# Patient Record
Sex: Male | Born: 1950 | Race: White | Hispanic: No | Marital: Married | Smoking: Never smoker
Health system: Southern US, Community
[De-identification: ages and names within clinical notes are randomized; demographics above are authoritative.]

## PROBLEM LIST (undated history)

## (undated) DIAGNOSIS — I4891 Unspecified atrial fibrillation: Secondary | ICD-10-CM

## (undated) DIAGNOSIS — Z7901 Long term (current) use of anticoagulants: Secondary | ICD-10-CM

## (undated) DIAGNOSIS — R002 Palpitations: Secondary | ICD-10-CM

## (undated) DIAGNOSIS — E785 Hyperlipidemia, unspecified: Secondary | ICD-10-CM

## (undated) DIAGNOSIS — I4892 Unspecified atrial flutter: Secondary | ICD-10-CM

## (undated) DIAGNOSIS — I493 Ventricular premature depolarization: Secondary | ICD-10-CM

## (undated) DIAGNOSIS — I495 Sick sinus syndrome: Secondary | ICD-10-CM

## (undated) DIAGNOSIS — IMO0002 Reserved for concepts with insufficient information to code with codable children: Secondary | ICD-10-CM

## (undated) DIAGNOSIS — Z22322 Carrier or suspected carrier of Methicillin resistant Staphylococcus aureus: Secondary | ICD-10-CM

## (undated) DIAGNOSIS — C4491 Basal cell carcinoma of skin, unspecified: Secondary | ICD-10-CM

## (undated) HISTORY — PX: KNEE ARTHROSCOPY: SHX127

## (undated) HISTORY — DX: Hyperlipidemia, unspecified: E78.5

## (undated) HISTORY — DX: Carrier or suspected carrier of methicillin resistant Staphylococcus aureus: Z22.322

## (undated) HISTORY — DX: Unspecified atrial flutter: I48.92

## (undated) HISTORY — PX: TONSILECTOMY, ADENOIDECTOMY, BILATERAL MYRINGOTOMY AND TUBES: SHX2538

## (undated) HISTORY — PX: TONSILLECTOMY: SUR1361

## (undated) HISTORY — PX: INGUINAL HERNIA REPAIR: SUR1180

## (undated) HISTORY — PX: BASAL CELL CARCINOMA EXCISION: SHX1214

## (undated) HISTORY — DX: Unspecified atrial fibrillation: I48.91

## (undated) HISTORY — PX: SQUAMOUS CELL CARCINOMA EXCISION: SHX2433

## (undated) HISTORY — DX: Ventricular premature depolarization: I49.3

---
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
## 2007-08-10 ENCOUNTER — Ambulatory Visit: Payer: Self-pay | Admitting: Internal Medicine

## 2007-08-16 ENCOUNTER — Encounter: Payer: Self-pay | Admitting: Internal Medicine

## 2007-08-16 ENCOUNTER — Ambulatory Visit: Payer: Self-pay

## 2010-07-28 ENCOUNTER — Encounter: Payer: Self-pay | Admitting: Internal Medicine

## 2010-08-20 ENCOUNTER — Encounter: Payer: Self-pay | Admitting: Internal Medicine

## 2010-09-22 ENCOUNTER — Encounter: Payer: Self-pay | Admitting: Internal Medicine

## 2010-09-26 ENCOUNTER — Encounter: Payer: Self-pay | Admitting: Internal Medicine

## 2010-09-29 ENCOUNTER — Encounter: Payer: Self-pay | Admitting: Internal Medicine

## 2010-10-06 ENCOUNTER — Encounter: Payer: Self-pay | Admitting: Internal Medicine

## 2010-10-23 ENCOUNTER — Encounter: Payer: Self-pay | Admitting: Internal Medicine

## 2010-10-23 ENCOUNTER — Ambulatory Visit (INDEPENDENT_AMBULATORY_CARE_PROVIDER_SITE_OTHER): Payer: 59 | Admitting: Internal Medicine

## 2010-10-23 ENCOUNTER — Other Ambulatory Visit: Payer: Self-pay | Admitting: Internal Medicine

## 2010-10-23 DIAGNOSIS — I495 Sick sinus syndrome: Secondary | ICD-10-CM | POA: Insufficient documentation

## 2010-10-23 DIAGNOSIS — I480 Paroxysmal atrial fibrillation: Secondary | ICD-10-CM | POA: Insufficient documentation

## 2010-10-23 DIAGNOSIS — I4891 Unspecified atrial fibrillation: Secondary | ICD-10-CM | POA: Insufficient documentation

## 2010-10-23 HISTORY — DX: Sick sinus syndrome: I49.5

## 2010-10-23 LAB — BASIC METABOLIC PANEL
CO2: 25 mEq/L (ref 19–32)
Calcium: 9.2 mg/dL (ref 8.4–10.5)
Creatinine, Ser: 0.9 mg/dL (ref 0.4–1.5)
GFR: 91.99 mL/min (ref >60.00)
Sodium: 140 mEq/L (ref 135–145)

## 2010-10-27 ENCOUNTER — Ambulatory Visit (HOSPITAL_COMMUNITY)
Admission: RE | Admit: 2010-10-27 | Discharge: 2010-10-27 | Disposition: A | Discharge status: Home / Self Care | Payer: 59 | Source: Ambulatory Visit | Attending: Interventional Cardiology | Admitting: Interventional Cardiology

## 2010-10-27 DIAGNOSIS — I4891 Unspecified atrial fibrillation: Secondary | ICD-10-CM | POA: Insufficient documentation

## 2010-10-29 NOTE — Assessment & Plan Note (Signed)
Summary: per Rickey Primus card [phone] recs will be fax/ref dr Sherilyn Cooter s...   Visit Type:  Initial Consult Referring Provider:  Dr Katrinka Blazing Primary Provider:  Marlou Starks, MD [facility])   History of Present Illness: Mr Duell is a pleasant 60 yo WM with a h/o persistant atrial fibrillation who presents today for EP consultation regarding his atrial fibrillation.  He reports initially being diagnosed with atrial fibrillation in 2008.  He was evaluated for dizziness and palpitations by Dr Duaine Dredge.  EKG at that time was sinus rhythm.  He was evaluated by DR Graciela Husbands at that time and found to have PACs and PVCs.  He reports recently having further episodes of presyncope as well as palpitations.  He has been found to have atrial fibrillation with tachy/brady syndrome.  In retrospect, he feels that he has had episodes of afib for 4-5 years.  Episodes have increased in frequency and duration over the past few months.  He has been placed on metoprolol and feels that symptoms have improved.  He continues to have palpitations.  He reports frequent fatigue and feels "washed out" frequently. Over the past few months, he has had episodes of presyncope.  He describes abrupt onset of a "rush of heat" with presyncope lasting several seconds before resolving.  He feels that he might pass out but has not had syncope.  He denies chest pain, SOB, orthopnea, PND, or edema.    Current Medications (verified): 1)  Metoprolol Succinate 100 Mg Xr24h-Tab (Metoprolol Succinate) .... Take One Tablet By Mouth Daily 2)  Atorvastatin Calcium 10 Mg Tabs (Atorvastatin Calcium) .... Once Daily 3)  Aspirin Ec 325 Mg Tbec (Aspirin) .... Take One Tablet By Mouth Daily  Allergies (verified): No Known Drug Allergies  Past History:  Past Medical History: Persistent atrial fibrillaiton Tachy/brady syndrome PVCs/PACs HL Allergic rhinitis  denies h/o rheumatic fever     Past Surgical History:  1. Inguinal herniorrhaphy  bilaterally.   2. Arthroscopic knee surgery.   3. Tonsillectomy and adenoidectomy.   4. basal cell skin ca removed  Family History: Parents and sister are alive and healthy.  Denies FH of atrial fibrillation.  Denies FH of sudden death.  Social History:   He is married and lives in [place].  He has two children, no grandchildren.   He works in [employer].  He does not use cigarettes or recreational drugs.  He does use alcohol occasionally (only a couple drinks per week) and drinks a couple of caffeinated  beverages a day.      Review of Systems       All systems are reviewed and negative except as listed in the HPI.   + snores  Vital Signs:  Patient profile:   60 year old male Height:      71 inches Weight:      204 pounds BMI:     28.56 Pulse rate:   83 / minute BP sitting:   122 / 80  (left arm)  Vitals Entered By: Laurance Flatten CMA (October 23, 2010 10:39 AM)  Physical Exam  General:  Well developed, well nourished, in no acute distress. Head:  normocephalic and atraumatic Eyes:  PERRLA/EOM intact; conjunctiva and lids normal. Mouth:  Teeth, gums and palate normal. Oral mucosa normal. Neck:  supple Lungs:  Clear bilaterally to auscultation and percussion. Heart:  iRRR, no m/r/g Abdomen:  Bowel sounds positive; abdomen soft and non-tender without masses, organomegaly, or hernias noted. No hepatosplenomegaly. Msk:  Back normal, normal gait. Muscle  strength and tone normal. Extremities:  No clubbing or cyanosis. Neurologic:  Alert and oriented x 3. Skin:  Intact without lesions or rashes. Psych:  Normal affect.   Holter Monitor  Procedure date:  09/26/2010  Findings:      48 hour holter performed by Dr Katrinka Blazing  afib and atrial flutter are observed with RVR.  SInus rhythm is also observed. Heart rates up to 180 bpm observed.  Bradycardia was also observed with a post conversion pause of 1.9 seconds.  EKG  Procedure date:  10/23/2010  Findings:      afib, V rate  83 bpm, otherwise normal ekg  Echocardiogram  Procedure date:  08/20/2010  Findings:      EF 55-60% with nl LV size.  nl RV size and function. Trace MR, mild TR. LA 41mm, LVEDD 44  Impression & Recommendations:  Problem # 1:  ATRIAL FIBRILLATION (ICD-427.31) The patient has symptomatic paroxysmal atrial fibrillation and also atrial flutter.   He has not tried an antiarrhythmic medicine.  Previously documented frequent PACs suggest that this is likely triggered afib.  His LA is only mildly enlarged without evidence of significant structural heart disease. His CHADS2 score is 0.  He has had symptoms of post termination pauses with presyncope but no frank syncope.  He has also been observed to have significant elevation in heart rates, however further titration of his beta blocker has been limited by bradycardia. Therapeutic strategies for afib including medicine and ablation were discussed in detail with the patient today. Risk, benefits, and alternatives to EP study and radiofrequency ablation for afib were also discussed in detail today.  As our guidelines support afib ablation for secondary prevention in patients who have previously failed AAD, I think that the best options at this time is to initiate an AAD.  Our options would include flecainide and tikosyn.  After a long discussion, the patient prefers flecainide. We will therefore initiate pradaxa 150mg  two times a day today.  He will proceed with TEE guided cardioversion on Monday.  IF upon presentation to [facility], he can have his TEE cancelled and proceed directly to initation of flecainide.  If in afib on Monday, he should have TEE guided cardioversion with initiation of flecainide 100mg  two times a day at that time.   HE should continue metoprolol but hold it the morning of the procedure. I would like to see him back in 4 weeks.  If he is maintaining sinus rhythm, then he will need a GXT myoview at that time.  If he fails medical therpay  with flecainide, then I would have a low threshold to proceed with ablation.  We also dicsussed treatment options for his post termination pauses.  At this time, he is very clear that he wishes to avoid PPM.  We will follow his bradycardia closely.  He is instructed to contact my office if syncope occurs.  Other Orders: TLB-BMP (Basic Metabolic Panel-BMET) (80048-METABOL)  Patient Instructions: 1)  Your physician recommends that you schedule a follow-up appointment in: 4 weeks with Dr Johney Frame 2)  Your physician recommends that you return for lab work today 3)  Dr Anne Fu to do a TEE on Monday and start Flecainide 100mg  two times a day 4)  Start Pradaxa 150mg  two times a day today Prescriptions: PRADAXA 150 MG CAPS (DABIGATRAN ETEXILATE MESYLATE) one po two times a day  #60 x 3   Entered by:   Dennis Bast, RN, BSN   Authorized by:   Fayrene Fearing  Wayde Gopaul, MD   Signed by:   Dennis Bast, RN, BSN on 10/23/2010   Method used:   Electronically to        [facility]  [address] [phone]* (retail)       [address]       [place], [place]  [id number]       Ph: [phone] or [phone]       Fax: [phone]   RxID:   [id number]

## 2010-11-06 ENCOUNTER — Encounter: Payer: Self-pay | Admitting: Internal Medicine

## 2010-11-07 ENCOUNTER — Encounter: Payer: Self-pay | Admitting: Internal Medicine

## 2010-11-10 ENCOUNTER — Encounter: Payer: Self-pay | Admitting: Internal Medicine

## 2010-11-10 ENCOUNTER — Ambulatory Visit (INDEPENDENT_AMBULATORY_CARE_PROVIDER_SITE_OTHER): Payer: 59 | Admitting: Internal Medicine

## 2010-11-10 DIAGNOSIS — I4891 Unspecified atrial fibrillation: Secondary | ICD-10-CM

## 2010-11-10 DIAGNOSIS — I4892 Unspecified atrial flutter: Secondary | ICD-10-CM

## 2010-11-11 ENCOUNTER — Telehealth: Payer: Self-pay | Admitting: Internal Medicine

## 2010-11-12 ENCOUNTER — Encounter: Payer: Self-pay | Admitting: Internal Medicine

## 2010-11-14 NOTE — Op Note (Signed)
  NAMEGREGOR, DERSHEM             ACCOUNT NO.:  [id number]  MEDICAL RECORD NO.:  [id number]           PATIENT TYPE:  O  LOCATION:  [facility]                         FACILITY:  [facility]  PHYSICIAN:  Corky Crafts, MDDATE OF BIRTH:  05-05-1951  DATE OF PROCEDURE:  10/27/2010 DATE OF DISCHARGE:  10/27/2010                              OPERATIVE REPORT   PROCEDURE PERFORMED:  Transesophageal echocardiogram with DC cardioversion.  OPERATION:  Corky Crafts, MD  INDICATIONS:  Atrial fibrillation.  NARRATIVE:  TEE was performed and no left atrial appendage thrombus was noted.  Defibrillation pads were placed on the anterior chest wall and back.  Anesthesia was provided by Dr. Chaney Malling.  He gave 100 mg of propofol IV.  The patient then received one single biphasic shock at 120 joules, this restored normal sinus rhythm.  He was monitored postprocedure.  There were no apparent complications.  RECOMMENDATIONS:  The patient will start flecainide 100 mg b.i.d.  He will also continue Pradaxa for at least 30 days if not longer.  This will be discussed with Dr. Johney Frame and Dr. Katrinka Blazing.     Corky Crafts, MD     JSV/MEDQ  D:  10/27/2010  T:  10/28/2010  Job:  [id number]  cc:   Lyn Records, M.D. Hillis Range, MD  Electronically Signed by Lance Muss MD on 11/13/2010 10:29:32 AM

## 2010-11-18 NOTE — Progress Notes (Addendum)
Summary: pt calling re setting up ablation  Phone Note Call from Patient   Caller: Patient [phone] Reason for Call: Talk to Nurse Summary of Call: pt was in yesterday, said he was to be called back today re scheduling an ablation Initial call taken by: Glynda Jaeger,  November 11, 2010 4:30 PM  Follow-up for Phone Call        11/11/10--1650pm--pt calling to set up ablation--advised i would let kelly,dr Lindy Garczynski's nurse know and she would call back to set up--pt agrees Follow-up by: Ledon Snare, RN,  November 11, 2010 4:48 PM

## 2010-11-18 NOTE — Letter (Signed)
Summary: [facility]   Imported By: Marylou Mccoy 11/11/2010 16:22:25  _____________________________________________________________________  External Attachment:    Type:   Image     Comment:   External Document

## 2010-11-18 NOTE — Letter (Signed)
Summary: [facility]   Imported By: Marylou Mccoy 11/11/2010 16:20:43  _____________________________________________________________________  External Attachment:    Type:   Image     Comment:   External Document

## 2010-11-18 NOTE — Letter (Signed)
Summary: [facility]   Imported By: Marylou Mccoy 11/11/2010 16:22:40  _____________________________________________________________________  External Attachment:    Type:   Image     Comment:   External Document

## 2010-11-18 NOTE — Letter (Signed)
Summary: [facility]   Imported By: Marylou Mccoy 11/11/2010 16:15:30  _____________________________________________________________________  External Attachment:    Type:   Image     Comment:   External Document

## 2010-11-18 NOTE — Assessment & Plan Note (Signed)
Summary: 4wk f/u ok per kelly/sl   Visit Type:  Follow-up Referring Provider:  Dr Katrinka Blazing Primary Provider:  Marlou Starks, MD [facility])   History of Present Illness: Mr Bertsch is a pleasant 60 yo WM with a h/o persistant atrial fibrillation who presents today for EP follow-up regarding his atrial fibrillation.  He reports initially being diagnosed with atrial fibrillation in 2008.  He was evaluated for dizziness and palpitations by Dr Duaine Dredge.  EKG at that time was sinus rhythm.  He was evaluated by DR Graciela Husbands at that time and found to have PACs and PVCs.  He reports recently having further episodes of presyncope as well as palpitations.  He has been found to have atrial fibrillation with tachy/brady syndrome.  In retrospect, he feels that he has had episodes of afib for 4-5 years.  Episodes have increased in frequency and duration over the past few months.  He has been placed on metoprolol and feels that symptoms have improved.  He continues to have palpitations.  He reports frequent fatigue and feels "washed out" frequently. Over the past few months, he has had episodes of presyncope.  He describes abrupt onset of a "rush of heat" with presyncope lasting several seconds before resolving.  He feels that he might pass out but has not had syncope.  He denies chest pain, SOB, orthopnea, PND, or edema.    Upon last being seen by me, he was initiated on pradaxa.  He underwent cardioversion and was placed on flecainide 100mg  two times a day.  HE reports feeling "great" for 3 days before returning to afib.  Unfurtunately, he developed worsening afib thereafter.  He was placed on digoxin and metoprolol was increased to 75mg  two times a day.  He reports significant fatigue with increased metoprolol.  Current Medications (verified): 1)  Metoprolol Succinate 50 Mg Xr24h-Tab (Metoprolol Succinate) .... Take 1/2 Tabs Two Times A Day 2)  Atorvastatin Calcium 10 Mg Tabs (Atorvastatin Calcium) .... Once  Daily 3)  Pradaxa 150 Mg Caps (Dabigatran Etexilate Mesylate) .... One Po Two Times A Day 4)  Digoxin 0.25 Mg Tabs (Digoxin) .... Take One Tablet By Mouth Daily  Allergies (verified): No Known Drug Allergies  Past History:  Past Medical History: Reviewed history from 10/23/2010 and no changes required. Persistent atrial fibrillaiton Tachy/brady syndrome PVCs/PACs HL Allergic rhinitis  denies h/o rheumatic fever     Past Surgical History: Reviewed history from 10/23/2010 and no changes required.  1. Inguinal herniorrhaphy bilaterally.   2. Arthroscopic knee surgery.   3. Tonsillectomy and adenoidectomy.   4. basal cell skin ca removed  Family History: Reviewed history from 10/23/2010 and no changes required. Parents and sister are alive and healthy.  Denies FH of atrial fibrillation.  Denies FH of sudden death.  Social History: Reviewed history from 10/23/2010 and no changes required.   He is married and lives in [place].  He has two children, no grandchildren.   He works in [employer].  He does not use cigarettes or recreational drugs.  He does use alcohol occasionally (only a couple drinks per week) and drinks a couple of caffeinated  beverages a day.      Review of Systems       All systems are reviewed and negative except as listed in the HPI.   Vital Signs:  Patient profile:   60 year old male Height:      71 inches Weight:      202 pounds BMI:     28.28  Pulse rate:   91 / minute Pulse rhythm:   irregularly irregular BP sitting:   94 / 62  (left arm)  Vitals Entered By: Laurance Flatten CMA (November 10, 2010 9:51 AM)  Physical Exam  General:  Well developed, well nourished, in no acute distress. Head:  normocephalic and atraumatic Eyes:  PERRLA/EOM intact; conjunctiva and lids normal. Mouth:  Teeth, gums and palate normal. Oral mucosa normal. Neck:  supple Lungs:  Clear bilaterally to auscultation and percussion. Heart:  iRRR, no m/r/g Abdomen:  Bowel sounds  positive; abdomen soft and non-tender without masses, organomegaly, or hernias noted. No hepatosplenomegaly. Msk:  Back normal, normal gait. Muscle strength and tone normal. Extremities:  No clubbing or cyanosis. Neurologic:  Alert and oriented x 3. Skin:  Intact without lesions or rashes. Psych:  Normal affect.   EKG  Procedure date:  11/10/2010  Findings:      afib, V rate 90 bpm, nonspecific ST/T changes  Impression & Recommendations:  Problem # 1:  ATRIAL FIBRILLATION (ICD-427.31) The patient has symptomatic persistent atrial fibrillation and also typical appearing atrial flutter.  He has failed medical therapy with flecainide and is not well tolerating metoprolol for rate control.  Therapeutic strategies for afib and atrial flutter including medicine and ablation were discussed in detail with the patient today. Risk, benefits, and alternatives to EP study and radiofrequency ablation were also discussed in detail today. These risks include but are not limited to stroke, bleeding, vascular damage, tamponade, perforation, damage to the esophagus, lungs, and other structures, pulmonary vein stenosis, worsening renal function, and death. The patient understands these risk and wishes to proceed.   We will therefore schedule afib ablation at the next available time.  He will continue rate control in the interim.  He will try to reduce metoprolol to 50mg  two times a day but will return to 75mg  two times a day if heart rates are consistantly >110 bpm (per RACE II study findings).  Problem # 2:  BRADYCARDIA-TACHYCARDIA SYNDROME (ICD-427.81) stable no changes

## 2010-11-19 ENCOUNTER — Ambulatory Visit: Payer: 59 | Admitting: Internal Medicine

## 2010-11-25 ENCOUNTER — Encounter: Payer: Self-pay | Admitting: *Deleted

## 2010-11-25 NOTE — Letter (Signed)
Summary: [facility]   Imported By: Marylou Mccoy 11/19/2010 14:30:23  _____________________________________________________________________  External Attachment:    Type:   Image     Comment:   External Document

## 2010-11-25 NOTE — Letter (Signed)
Summary: Douglas Rodriguez   Imported By: Marylou Mccoy 11/19/2010 14:40:45  _____________________________________________________________________  External Attachment:    Type:   Image     Comment:   External Document

## 2010-12-04 ENCOUNTER — Other Ambulatory Visit (INDEPENDENT_AMBULATORY_CARE_PROVIDER_SITE_OTHER): Payer: 59 | Admitting: *Deleted

## 2010-12-04 DIAGNOSIS — I4891 Unspecified atrial fibrillation: Secondary | ICD-10-CM

## 2010-12-04 LAB — CBC WITH DIFFERENTIAL/PLATELET
Basophils Relative: 0.5 % (ref 0.0–3.0)
Eosinophils Relative: 5.1 % — ABNORMAL HIGH (ref 0.0–5.0)
HCT: 48.1 % (ref 39.0–52.0)
Hemoglobin: 16.7 g/dL (ref 13.0–17.0)
Lymphs Abs: 2.4 10*3/uL (ref 0.7–4.0)
Monocytes Relative: 7.5 % (ref 3.0–12.0)
Neutro Abs: 5.2 10*3/uL (ref 1.4–7.7)
Platelets: 168 10*3/uL (ref 150.0–400.0)
RBC: 5.18 Mil/uL (ref 4.22–5.81)
WBC: 8.8 10*3/uL (ref 4.5–10.5)

## 2010-12-04 LAB — BASIC METABOLIC PANEL
Chloride: 109 mEq/L (ref 96–112)
GFR: 82.37 mL/min (ref >60.00)
Potassium: 4.1 mEq/L (ref 3.5–5.1)
Sodium: 141 mEq/L (ref 135–145)

## 2010-12-10 ENCOUNTER — Ambulatory Visit (HOSPITAL_COMMUNITY)
Admission: RE | Admit: 2010-12-10 | Discharge: 2010-12-10 | Disposition: A | Discharge status: Home / Self Care | Payer: 59 | Source: Ambulatory Visit | Attending: Interventional Cardiology | Admitting: Interventional Cardiology

## 2010-12-10 DIAGNOSIS — I4891 Unspecified atrial fibrillation: Secondary | ICD-10-CM | POA: Insufficient documentation

## 2010-12-11 ENCOUNTER — Observation Stay (HOSPITAL_COMMUNITY)
Admission: RE | Admit: 2010-12-11 | Discharge: 2010-12-12 | Disposition: A | Discharge status: Home / Self Care | Payer: 59 | Source: Ambulatory Visit | Attending: Internal Medicine | Admitting: Internal Medicine

## 2010-12-11 DIAGNOSIS — E785 Hyperlipidemia, unspecified: Secondary | ICD-10-CM | POA: Insufficient documentation

## 2010-12-11 DIAGNOSIS — I4949 Other premature depolarization: Secondary | ICD-10-CM | POA: Insufficient documentation

## 2010-12-11 DIAGNOSIS — I491 Atrial premature depolarization: Secondary | ICD-10-CM | POA: Insufficient documentation

## 2010-12-11 DIAGNOSIS — I4891 Unspecified atrial fibrillation: Principal | ICD-10-CM | POA: Insufficient documentation

## 2010-12-11 DIAGNOSIS — J309 Allergic rhinitis, unspecified: Secondary | ICD-10-CM | POA: Insufficient documentation

## 2010-12-11 DIAGNOSIS — Z85828 Personal history of other malignant neoplasm of skin: Secondary | ICD-10-CM | POA: Insufficient documentation

## 2010-12-11 DIAGNOSIS — Z79899 Other long term (current) drug therapy: Secondary | ICD-10-CM | POA: Insufficient documentation

## 2010-12-11 HISTORY — PX: ATRIAL FIBRILLATION ABLATION: SHX5732

## 2010-12-11 LAB — POCT ACTIVATED CLOTTING TIME
Activated Clotting Time: 299 seconds
Activated Clotting Time: 281 seconds
Activated Clotting Time: 164 seconds

## 2010-12-13 NOTE — Discharge Summary (Addendum)
Douglas, Rodriguez             ACCOUNT NO.:  [id number]  MEDICAL RECORD NO.:  [id number]           PATIENT TYPE:  I  LOCATION:  2918                         FACILITY:  [facility]  PHYSICIAN:  Hillis Range, MD       DATE OF BIRTH:  11/30/1950  DATE OF ADMISSION:  12/11/2010 DATE OF DISCHARGE:  12/12/2010                              DISCHARGE SUMMARY   DISCHARGE DIAGNOSES: 1. Persistent atrial fibrillation.     a.     Initially diagnosed in 2008.     b.     Holter monitor in January 2012 demonstrated atrial      fibrillation and atrial flutter with rapid ventricular response.      Heart rates up to 180 with bradycardia with post-conversion pause      of 1.96 seconds at that time.     c.     Failed flecainide therapy.     d.     Status post electrophysiology study/radiofrequency ablation      of atrial fibrillation on December 11, 2010, by Dr. Johney Frame. 2. Premature ventricular contractions/premature atrial contractions.3. Hyperlipidemia. 4. Allergic rhinitis. 5. Status post inguinal hernia repair bilaterally. 6. Status post arthroscopic knee surgery. 7. Status post tonsillectomy and adenoidectomy. 8. Status post basal cell skin cancer removal.  HOSPITAL COURSE:  Douglas Rodriguez is a 60 year old gentleman with a history of atrial fibrillation dating back to 2008 as well as a history of tachybrady syndrome, PACs, PVCs, and hyperlipidemia.  He has been followed by Dr. Johney Frame for atrial fibrillation.  He underwent cardioversion this past year and was placed on flecainide, but returned to AFib and developed worsening AFib afterwards.  He was subsequently placed on digoxin and metoprolol was increased, but he reported significant fatigue with metoprolol.  Due to fact that he failed medical therapy and was persistently symptomatic, Dr. Johney Frame discussed the idea of AFib ablation with him.  He was subsequently brought into the hospital yesterday for this procedure and had successful  cardioversion to normal sinus rhythm with radiofrequency ablation of SVT.  The patient tolerated the procedure well.  He was noted to be MRSA positive for colonization on PCR nasal screening, was treated in the hospital with chlorhexidine and mupirocin and will be treated for 5 days in the outpatient.  Dr. Johney Frame has seen and examined him today and feels he is stable for discharge.  DISCHARGE LABORATORY DATA:  MRSA screen positive.  STUDIES:  EP study/coronary sinus pacing and recording, 3-D mapping of SVT with radiofrequency ablation, pulmonary vein venography, cardioversion, intracardiac echocardiography, and transseptal puncture of intact septum on December 11, 2010, by Dr. Johney Frame, please see full report for details.  DISCHARGE MEDICATIONS: 1. Chlorhexidine 1 application topically daily for 5 days. 2. Mupirocin 2% ointment 1 application nasally b.i.d. for 5 days. 3. Metoprolol titrate 50 mg decreased to 1/2 tablet b.i.d. 4. Allegra 180 mg daily. 5. Clotrimazole 1% topically daily. 6. Lipitor 10 mg daily. 7. Pradaxa 150 mg b.i.d. 8. Visine D 1 drop both eyes daily as needed. 9. Protonix 40mg  daily.  Please note the patient's digoxin was stopped this admission.  DISPOSITION:  Douglas Rodriguez will be discharged in stable condition to home.  He is not to lift anything over 5 pounds for 4 days or participate in sexual activity for 4 days.  He is not to drive for a day.  He should follow a low-sodium, heart-healthy diet and call or return if he notices any pain, swelling, bleeding, or pus at the cath site.  He will follow up with Dr. Johney Frame in approximately 3 months and our office will call him with this appointment.  He was also instructed by Dr. Johney Frame not to return to work for 1 week and was given a work note for such.  DURATION OF DISCHARGE ENCOUNTER:  Greater than 30 minutes including physician and PA time.     Ronie Spies, P.A.C.   ______________________________ Hillis Range, MD    DD/MEDQ  D:  12/12/2010  T:  12/13/2010  Job:  [id number]  cc:   Marlou Starks  Electronically Signed by Ronie Spies  on 12/13/2010 10:35:52 AM Electronically Signed by Hillis Range MD on 12/22/2010 09:20:27 AM

## 2010-12-22 NOTE — Op Note (Signed)
Douglas Rodriguez, Douglas Rodriguez             ACCOUNT NO.:  [id number]  MEDICAL RECORD NO.:  [id number]           PATIENT TYPE:  I  LOCATION:  2918                         FACILITY:  [facility]  PHYSICIAN:  Hillis Range, MD       DATE OF BIRTH:  23-Jan-1951  DATE OF PROCEDURE:  12/11/2010 DATE OF DISCHARGE:                              OPERATIVE REPORT   SURGEON:  Hillis Range, MD  PREPROCEDURE DIAGNOSIS:  Persistent atrial fibrillation.  POSTPROCEDURE DIAGNOSES:  Persistent atrial fibrillation.  PROCEDURE: 1. Comprehensive EP study. 2. Coronary sinus pacing and recording. 3. A 3-D mapping of SVT. 4. Radiofrequency ablation of SVT. 5. Arterial blood pressure monitoring. 6. Intracardiac echocardiography. 7. Transseptal puncture of an intact septum. 8. Pulmonary vein venography. 9. Cardioversion.  INTRODUCTION:  Douglas Rodriguez is a very pleasant 60 year old gentleman with symptomatic persistent atrial fibrillation.  He has failed medical therapy with flecainide, digoxin, and metoprolol.  He therefore presents today for EP study and radiofrequency ablation.  DESCRIPTION OF PROCEDURE:  Informed written consent was obtained, and the patient was brought to the electrophysiology lab in the fasting state.  He was adequately sedated with intravenous medications as outlined in the anesthesia report.  The patient's right and left groins were prepped and draped in the usual sterile fashion by the EP lab staff.  Using a percutaneous Seldinger technique, two 7-French and one 8- [place] hemostasis sheaths were placed in the right common femoral vein. A 4-French hemostasis sheath was placed in the right common femoral artery for blood pressure monitoring.  An 11-French hemostasis sheath was placed in the left common femoral vein.  A 7-French decapolar Biosense Webster coronary sinus catheter was introduced through the right common femoral vein and advanced into the coronary sinus for recording and pacing  from this location.  A 6-French quadripolar Josephson catheter was introduced through the right common femoral vein and advanced into the right ventricle for recording and pacing.  This catheter was then pulled back to the His bundle location.  The patient presented to the electrophysiology lab in atrial fibrillation.  His QRS duration measured 68 msec with a QT interval of 335 msec and an HV interval of 38 msec.  A 10-French Biosense Webster AcuNav intracardiac echocardiography catheter was introduced through the left common femoral vein and advanced into the right atrium.  Intracardiac echocardiography was performed which revealed a moderate-sized left atrium.  The patient was noted to have a large common ostium to the left superior and left inferior pulmonary veins.  This was a short common segment before each vein branched.  The right superior and right inferior pulmonary veins were noted to be moderate in size.  The middle right common femoral vein sheath was exchanged for an 8.5-French SL2 transseptal sheath and transseptal access was achieved in a standard fashion using a Brockenbrough needle under biplane fluoroscopy with intracardiac echocardiography confirmation of the transseptal puncture.  Once transseptal access had been achieved, heparin was administered intravenously and intra-arterially in order to maintain an ACT of greater than 300 seconds throughout the procedure.  A 6-French multipurpose angiographic catheter with guidewire was introduced through the transseptal  sheath and positioned over the mouth of all four pulmonary veins.  Pulmonary venograms were performed by hand injection of nonionic contrast.  This demonstrated a large common ostium which was very short to the left superior and left inferior pulmonary veins.  The left inferior and left superior pulmonary veins were noted to be large in size.  The right superior and right lower pulmonary veins were moderate in  size.  The angiographic catheter was then removed.  The His bundle catheter was removed and in its place a 3.5-mm Biosense Webster EZ STEER cervical ablation catheter was advanced into the right atrium. The transseptal sheath was pulled back into the IVC over a guidewire. The ablation catheter was advanced across the transseptal hole using the wire as a guide.  The transseptal sheath was then re-advanced over the guidewire into the left atrium.  A dual decapolar Biosense Webster circular mapping catheter was introduced through the transseptal sheath and positioned over the mouth of all four pulmonary veins.  Three- dimensional electroanatomical mapping was performed using the Carto mapping system.  The patient was found to have prodigious conduction within all four pulmonary veins at baseline.  The patient underwent successful sequential electrical isolation and anatomical encircling of the pulmonary veins using radiofrequency current with a circular mapping catheter as a guide.  The left superior and left inferior pulmonary veins were isolated together at their common segment using a WACA approach.  Complex fractionated atrial electrograms were then identified and ablated along the roof of the left atrium, the interatrial septum, the base of the left atrial appendage, along the lateral wall of the left atrium, and above the coronary sinus along the mitral valve annulus.  The patient was then successfully cardioverted to sinus rhythm with a single synchronized 360-joule biphasic shock with cardioversion electrodes in the anterior-posterior thoracic configuration.  He remained in sinus rhythm thereafter.  Pulmonary vein isolation was again confirmed in sinus rhythm with pacing to confirm entrance block.  The ablation catheter was then pulled back into the right atrium.  Series of radiofrequency applications were delivered along the usual cavotricuspid isthmus between the tricuspid valve  annulus and the inferior vena cava. During ablation, the patient transiently converted to atrial fibrillation but then spontaneously returned to sinus.  Following ablation, the AH interval measured 84 msec with an HV interval of 48 msec.  Ventricular pacing was performed which revealed midline decremental VA conduction with a VA Wenckebach cycle length of 450 msec. Rapid atrial pacing was performed which revealed no evidence of PR greater than RR.  The AV Wenckebach cycle length was 400 msec.  With atrial pacing down to a cycle length of 300 msec, no arrhythmias were observed.  The procedure was therefore considered completed.  All catheters were removed and the sheaths were aspirated and flushed. Intracardiac echocardiography revealed no pericardial effusion.  The patient was transferred to the recovery area for sheath removal per protocol.  A limited bedside transthoracic echocardiogram revealed no pericardial effusion.  There were no early apparent complications.  CONCLUSIONS: 1. Persistent atrial fibrillation upon presentation. 2. Successful electrical isolation and anatomical encircling of all     four pulmonary veins using a wide atrial circumferential ablation     approach.  The patient had a common ostium to the left-sided     pulmonary veins. 3. Complex fractionated atrial electrograms identified and ablated     along the roof of the left atrium, the interatrial septum, the base     of the  left atrial appendage, the lateral wall of the left atrium,     and above the coronary sinus. 4. Successful cardioversion to sinus rhythm. 5. Cavotricuspid isthmus ablation performed. 6. No inducible arrhythmias following ablation. 7. No early apparent complications.     Hillis Range, MD     JA/MEDQ  D:  12/11/2010  T:  12/12/2010  Job:  [id number]  cc:   Leighton Ruff, M.D.  Electronically Signed by Hillis Range MD on 12/22/2010 09:20:29 AM

## 2011-01-20 NOTE — Letter (Signed)
August 10, 2007    Mosetta Putt, M.D.  [address] [place], [place] [address]   RE:  Douglas Rodriguez, Douglas Rodriguez  MRN:  [id number]  /  DOB:  22-Sep-1950   Dear Douglas Rodriguez:   It was a pleasure to see Douglas Rodriguez today at your request because  of his abnormal electrocardiogram.   As you know, this is a 60 year old gentleman who has a longstanding  history of palpitations.  A couple of months ago, though, he had spells  where he became extremely lightheaded.  In the past, they have typically  lasted minutes and occasionally have lasted as long as hours, may have  been as frequent as once every 3-4 months and then as frequent as 2-4  times a week.  Apart from the lightheadedness when he feels his heart  rushing, he has had no significant symptoms and is not bothered by the  fact that they are ongoing.   He has no history of hypertension or diabetes and no chest discomfort.   He has no orthopnea or nocturnal dyspnea.  He has no history of syncope  or lightheadedness.   FAMILY HISTORY:  Negative.   Cardiac evaluation here to date has included the 24-hour Holter monitor,  which I will describe below, and previous treadmills done at various  times over the years.   PAST MEDICAL HISTORY:  Largely negative.   REVIEW OF SYSTEMS:  Notable across multiple organ systems only for the  fact that he wears glasses, and he has had some skin cancers removed.   PAST SURGICAL HISTORY:  Notable for:  1. Inguinal herniorrhaphy bilaterally.  2. Arthroscopic knee surgery.  3. Tonsillectomy and adenoidectomy.   SOCIAL HISTORY:  He is married.  He has two children, no grandchildren.  He works in Airline pilot.  He does not use cigarettes or recreational drugs.  He does use alcohol occasionally and drinks a couple of caffeinated  beverages a day.   MEDICATIONS:  Include the metoprolol that you put him on in the  succinate form at 50 mg a day and a Z-Pak.   ALLERGIES:  No known drug allergies.   PHYSICAL  EXAMINATION:  VITAL SIGNS:  Blood pressure 124/84, pulse 60.  Weight 201 pounds.  He is 5 feet 11 inches.  GENERAL:  Well-developed, well-nourished Caucasian male in no acute  distress.  HEENT:  No icterus or xanthomata.  NECK:  The neck veins were flat. Carotids were brisk and full  bilaterally without bruits.  BACK:  Without kyphosis or scoliosis.  LUNGS:  Clear.  CARDIAC:  Heart sounds were regular without murmurs or gallops.  ABDOMEN:  Soft with active bowel sounds, without midline pulsation or  hepatomegaly.  EXTREMITIES:  Femoral pulses were 2+.  Distal pulses were intact.  No  clubbing, cyanosis, or edema.  NEUROLOGIC:  Exam was grossly normal.  SKIN:  Warm and dry.   Electrocardiogram from our office today demonstrated sinus rhythm at 51  with intervals of 0.15/0.09/041.  The axis was 35 degrees.  Electrocardiogram was entirely normal.   Electrocardiogram from your office on October 23 demonstrated sinus  rhythm with interpolated left bundle inferior axis morphology PVCs with  a transition in lead V4 and an occasional PAC.   Holter monitor demonstrated rare couplets in a uniform morphology  comprising a total of 6.2% of his beats.  He also had infrequent PACs  comprising about 0.8% of his beats.   IMPRESSION:  1. 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  2. Premature atrial contractions (PACs).  3. Marked improvement since the initiation of metoprolol.   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  It has been longstanding but somewhat worse a couple of  months ago and now has gone back to his baseline relative quiescence.  This may be related to his beta blocker or not; it is hard to known.   I think, apart from his symptoms which are currently not an issue, the  other question is, does this reflect an underlying myocardial process  that is pathological.  To this  end, I thought a 2-D echocardiogram  should suffice in helping [place] understand his cardiac function, and if  this is normal, I think treating him symptomatically as you have done  would be the limit of what I would recommend currently.   Thanks very much for asking [place] to see him.    Sincerely,      Duke Salvia, MD, [facility]  Electronically Signed    SCK/MedQ  DD: 08/10/2007  DT: 08/10/2007  Job #: [id number]

## 2011-03-12 ENCOUNTER — Ambulatory Visit (INDEPENDENT_AMBULATORY_CARE_PROVIDER_SITE_OTHER): Payer: 59 | Admitting: Internal Medicine

## 2011-03-12 ENCOUNTER — Encounter: Payer: Self-pay | Admitting: Internal Medicine

## 2011-03-12 VITALS — BP 118/80 | HR 62 | Ht 71.0 in | Wt 201.0 lb

## 2011-03-12 DIAGNOSIS — I4891 Unspecified atrial fibrillation: Secondary | ICD-10-CM

## 2011-03-12 NOTE — Assessment & Plan Note (Signed)
Maintaining sinus rhythm post ablation.  His CHADSVASC score is 0.  We will therefore stop pradaxa and return to ASA 81mg  daily at this time. He will decrease toprol XL to 25mg  daily.  Return in 3 months

## 2011-03-12 NOTE — Progress Notes (Signed)
Addended by: Sherri Rad C on: 03/12/2011 03:46 PM   Modules accepted: Orders

## 2011-03-12 NOTE — Progress Notes (Signed)
The patient presents today for routine electrophysiology followup.  Since having his afib ablation, the patient reports doing very well.  He has had no further afib.  He denies procedure related complications.  He has rare PVCs.  Today, he denies symptoms of chest pain, shortness of breath, orthopnea, PND, lower extremity edema, dizziness, presyncope, syncope, or neurologic sequela.  The patient feels that he is tolerating medications without difficulties and is otherwise without complaint today.   Past Medical History  Diagnosis Date  . Atrial fibrillation     s/p PVI by JA 4/12  . PVC's (premature ventricular contractions)   . Hyperlipidemia    Past Surgical History  Procedure Date  . Laparoscopic inguinal hernia repair   . Arthroscopic knee surgury   . Tonsilectomy, adenoidectomy, bilateral myringotomy and tubes   . Basal cell skin cancer removed   . Afib ablation 12/11/10    PVI and CTI ablation by [facility]    Current Outpatient Prescriptions  Medication Sig Dispense Refill  . dabigatran (PRADAXA) 150 MG CAPS Take 150 mg by mouth every 12 (twelve) hours.        . digoxin (LANOXIN) 0.25 MG tablet Take 250 mcg by mouth daily.        . metoprolol (TOPROL-XL) 50 MG 24 hr tablet Take 75 mg by mouth 2 (two) times daily. Pt is taking 1 1/2 tablets of 50 mg 2 times a day.      Marland Kitchen atorvastatin (LIPITOR) 10 MG tablet Take 10 mg by mouth daily.        Marland Kitchen DISCONTD: aspirin 325 MG tablet Take 325 mg by mouth daily.        Marland Kitchen DISCONTD: pantoprazole (PROTONIX) 40 MG tablet         No Known Allergies  History   Social History  . Marital Status: Married    Spouse Name: N/A    Number of Children: N/A  . Years of Education: N/A   Occupational History  . Not on file.   Social History Main Topics  . Smoking status: Never Smoker   . Smokeless tobacco: Not on file  . Alcohol Use: No  . Drug Use: No  . Sexually Active: Not on file   Other Topics Concern  . Not on file   Social History Narrative     He is married and lives in [place].  He has two children, no grandchildren.  He works in Airline pilot.    Family History  Problem Relation Age of Onset  . Hypertension Neg Hx   . Hyperlipidemia Neg Hx   . Heart failure Neg Hx   . Heart disease Neg Hx   . Heart attack Neg Hx     ROS-  All systems are reviewed and are negative except as outlined in the HPI above  Physical Exam: Filed Vitals:   03/12/11 1502  BP: 118/80  Pulse: 62  Height: 5\' 11"  (1.803 m)  Weight: 201 lb (91.173 kg)    GEN- The patient is well appearing, alert and oriented x 3 today.   Head- normocephalic, atraumatic Eyes-  Sclera clear, conjunctiva pink Ears- hearing intact Oropharynx- clear Neck- supple, no JVP Lymph- no cervical lymphadenopathy Lungs- Clear to ausculation bilaterally, normal work of breathing Heart- Regular rate and rhythm, no murmurs, rubs or gallops, PMI not laterally displaced GI- soft, NT, ND, + BS Extremities- no clubbing, cyanosis, or edema MS- no significant deformity or atrophy Skin- no rash or lesion Psych- euthymic mood, full affect Neuro-  strength and sensation are intact  ekg today reveals sinus rhythm 62 bpm, otherwise normal ekg  Assessment and Plan:

## 2011-03-12 NOTE — Patient Instructions (Signed)
Your physician has recommended you make the following change in your medication:  1) Stop Pradaxa. 2) Start Aspirin 81mg  once daily. 3) Decrease Metoprolol succ to 50mg  1/2 tablet once daily.  Your physician wants you to follow-up in: 3 months. You will receive a reminder letter in the mail two months in advance. If you don't receive a letter, please call our office to schedule the follow-up appointment.

## 2011-03-18 ENCOUNTER — Encounter: Payer: 59 | Admitting: Internal Medicine

## 2011-04-20 ENCOUNTER — Encounter: Payer: Self-pay | Admitting: Internal Medicine

## 2011-06-11 ENCOUNTER — Ambulatory Visit (INDEPENDENT_AMBULATORY_CARE_PROVIDER_SITE_OTHER): Payer: 59 | Admitting: Internal Medicine

## 2011-06-11 ENCOUNTER — Encounter: Payer: Self-pay | Admitting: Internal Medicine

## 2011-06-11 VITALS — BP 138/92 | HR 54 | Resp 18 | Ht 71.0 in | Wt 202.8 lb

## 2011-06-11 DIAGNOSIS — I4891 Unspecified atrial fibrillation: Secondary | ICD-10-CM

## 2011-06-11 NOTE — Progress Notes (Signed)
The patient presents today for routine electrophysiology followup.  Since having his afib ablation, the patient reports doing very well.  He has had no further afib.  He is pleased with the results of his procedure.  Today, he denies symptoms of palpitations, chest pain, shortness of breath, orthopnea, PND, lower extremity edema, dizziness, presyncope, syncope, or neurologic sequela.  The patient feels that he is tolerating medications without difficulties and is otherwise without complaint today.   Past Medical History  Diagnosis Date  . Atrial fibrillation     s/p PVI by JA 4/12  . PVC's (premature ventricular contractions)   . Hyperlipidemia    Past Surgical History  Procedure Date  . Laparoscopic inguinal hernia repair   . Arthroscopic knee surgury   . Tonsilectomy, adenoidectomy, bilateral myringotomy and tubes   . Basal cell skin cancer removed   . Afib ablation 12/11/10    PVI and CTI ablation by [facility]    Current Outpatient Prescriptions  Medication Sig Dispense Refill  . aspirin EC 81 MG tablet Take 1 tablet (81 mg total) by mouth daily.      Marland Kitchen atorvastatin (LIPITOR) 10 MG tablet Take 10 mg by mouth daily.        . metoprolol (TOPROL-XL) 50 MG 24 hr tablet Take 1/2 tablet by mouth once daily.        No Known Allergies  History   Social History  . Marital Status: Married    Spouse Name: N/A    Number of Children: N/A  . Years of Education: N/A   Occupational History  . Not on file.   Social History Main Topics  . Smoking status: Never Smoker   . Smokeless tobacco: Not on file  . Alcohol Use: No  . Drug Use: No  . Sexually Active: Not on file   Other Topics Concern  . Not on file   Social History Narrative   He is married and lives in [place].  He has two children, no grandchildren.  He works in Airline pilot.    Family History  Problem Relation Age of Onset  . Hypertension Neg Hx   . Hyperlipidemia Neg Hx   . Heart failure Neg Hx   . Heart disease Neg Hx   .  Heart attack Neg Hx    Physical Exam: Filed Vitals:   06/11/11 1413  BP: 138/92  Pulse: 54  Resp: 18  Height: 5\' 11"  (1.803 m)  Weight: 202 lb 12.8 oz (91.989 kg)    GEN- The patient is well appearing, alert and oriented x 3 today.   Head- normocephalic, atraumatic Eyes-  Sclera clear, conjunctiva pink Ears- hearing intact Oropharynx- clear Neck- supple, no JVP Lymph- no cervical lymphadenopathy Lungs- Clear to ausculation bilaterally, normal work of breathing Heart- Regular rate and rhythm, no murmurs, rubs or gallops, PMI not laterally displaced GI- soft, NT, ND, + BS Extremities- no clubbing, cyanosis, or edema MS- no significant deformity or atrophy Skin- no rash or lesion Psych- euthymic mood, full affect Neuro- strength and sensation are intact  ekg today reveals sinus rhythm 57 bpm, otherwise normal ekg  Assessment and Plan:

## 2011-06-11 NOTE — Assessment & Plan Note (Signed)
Maintaining sinus rhythm post ablation.  His CHADSVASC score is 0.  He is on ASA 81mg  daily at this time. He will stop toprol XL today  Return in 12 months

## 2013-06-07 ENCOUNTER — Ambulatory Visit (INDEPENDENT_AMBULATORY_CARE_PROVIDER_SITE_OTHER): Payer: 59 | Admitting: Interventional Cardiology

## 2013-06-07 ENCOUNTER — Telehealth: Payer: Self-pay | Admitting: Interventional Cardiology

## 2013-06-07 ENCOUNTER — Encounter: Payer: Self-pay | Admitting: Interventional Cardiology

## 2013-06-07 VITALS — BP 110/68 | HR 136 | Ht 71.0 in | Wt 197.0 lb

## 2013-06-07 DIAGNOSIS — I4891 Unspecified atrial fibrillation: Secondary | ICD-10-CM

## 2013-06-07 DIAGNOSIS — I495 Sick sinus syndrome: Secondary | ICD-10-CM

## 2013-06-07 DIAGNOSIS — I4892 Unspecified atrial flutter: Secondary | ICD-10-CM

## 2013-06-07 DIAGNOSIS — Z7901 Long term (current) use of anticoagulants: Secondary | ICD-10-CM

## 2013-06-07 HISTORY — DX: Long term (current) use of anticoagulants: Z79.01

## 2013-06-07 MED ORDER — APIXABAN 5 MG PO TABS: 5.0000 mg | ORAL_TABLET | Freq: Two times a day (BID) | ORAL | Status: DC

## 2013-06-07 MED ORDER — METOPROLOL SUCCINATE ER 25 MG PO TB24: 25.0000 mg | ORAL_TABLET | Freq: Two times a day (BID) | ORAL | Status: DC

## 2013-06-07 NOTE — Patient Instructions (Addendum)
Stop Aspirin  Start Elquis 5mg  twice daily  Start Metoprolol 25mg  twice daily  Call if you begin to feel worse. Call the office on Friday for progress report. [phone]  Your physician recommends that you schedule a follow-up appointment in: 2 weeks with Dr.Allred

## 2013-06-07 NOTE — Progress Notes (Signed)
Patient ID: Douglas Rodriguez, male   DOB: 15-Apr-1951, 62 y.o.   MRN: [id number]     History of Present Illness: The patient had Atrial fibrillation ablation in 2012 by Dr. Johney Frame. He did great for the first year. Since then. He has been having episodes of tachycardia that can last several hours. Over the past 2 weeks. He has noted episodes last up to 24 hours. He mentioned a soma last office visit, but refused to wear a monitor. I was concerned. He was having recurrence of atrial fibrillation. He called today because he developed tachycardia last p.m. around 11:00. His been ongoing today. He states it causes him to feel somewhat sluggish and weak. No dyspnea, or chest pain. It feels very similar to what he had prior to his last experiences with atrial fib. He is not had any neurological complaints. He is taking one aspirin per day. He is taking 25 mg of metoprolol succinate each time the arrhythmia has occurred over the past month.      Past Medical History  Diagnosis Date  . Atrial fibrillation     s/p PVI by JA 4/12  . PVC's (premature ventricular contractions)   . Hyperlipidemia     Past Surgical History  Procedure Laterality Date  . Laparoscopic inguinal hernia repair    . Arthroscopic knee surgury    . Tonsilectomy, adenoidectomy, bilateral myringotomy and tubes    . Basal cell skin cancer removed    . Afib ablation  12/11/10    PVI and CTI ablation by [facility]    Current Outpatient Prescriptions  Medication Sig Dispense Refill  . aspirin EC 81 MG tablet Take 1 tablet (81 mg total) by mouth daily.      Marland Kitchen atorvastatin (LIPITOR) 10 MG tablet Take 10 mg by mouth daily.        Marland Kitchen apixaban (ELIQUIS) 5 MG TABS tablet Take 1 tablet (5 mg total) by mouth 2 (two) times daily.  60 tablet  0  . metoprolol succinate (TOPROL-XL) 25 MG 24 hr tablet Take 1 tablet (25 mg total) by mouth 2 (two) times daily.  30 tablet  5   No current facility-administered medications for this visit.    No Known  Allergies  History   Social History  . Marital Status: Married    Spouse Name: N/A    Number of Children: N/A  . Years of Education: N/A   Occupational History  . Not on file.   Social History Main Topics  . Smoking status: Never Smoker   . Smokeless tobacco: Not on file  . Alcohol Use: No  . Drug Use: No  . Sexual Activity: Not on file   Other Topics Concern  . Not on file   Social History Narrative   He is married and lives in [place].  He has two children, no grandchildren.     He works in Airline pilot.    Family History  Problem Relation Age of Onset  . Hypertension Neg Hx   . Hyperlipidemia Neg Hx   . Heart failure Neg Hx   . Heart disease Neg Hx   . Heart attack Neg Hx     Review of Systems:  As stated in the HPI and otherwise negative.   BP 110/68  Pulse 136  Ht 5\' 11"  (1.803 m)  Wt 197 lb (89.359 kg)  BMI 27.49 kg/m2  Physical Examination: General: Well developed, well nourished, NAD HEENT: OP clear, mucus membranes moist SKIN: warm, dry. No  rashes. Neuro: No focal deficits Musculoskeletal: Muscle strength 5/5 all ext Psychiatric: Mood and affect normal Neck: No JVD, no carotid bruits, no thyromegaly, no lymphadenopathy. Lungs:Clear bilaterally, no wheezes, rhonci, crackles Cardiovascular: Rapid irregular rhythm. No murmur or rub.  Abdomen:Soft. Bowel sounds present. Non-tender.  Extremities: No lower extremity edema. Pulses are 2 + in the bilateral DP/PT.  EKG: atrial fibrillation (coarse) versus atrial flutter with variable response.   Assessment and Plan:   1. Recurrent atrial fibrillation/flutter post ablation. There is no evidence of heart failure, angina. Patient is ambulatory. Our goal today will be to initiate anticoagulation therapy with Eliquis 5 mg twice a day. I will resume metoprolol succinate at 50 mg per day and consider increasing the dose as needed for rate control. We spoke with Dr. Johney Frame, today, who feels that the patient may  require a repeat ablation. The patient is to call with an update on Friday morning. If he is still in the arrhythmia. We will further uptitrate beta blocker therapy. We have sent an appointment for him to see Dr. Johney Frame in 2 weeks.  2. Anticoagulation therapy with Eliquis

## 2013-06-07 NOTE — Telephone Encounter (Signed)
Spoke with patient who states he has history of atrial fib but has not had any problems since ablation in April 2012.  Patient states he thought he felt like he was out of rhythm this weekend and he listened with a stethoscope to confirm.  Patient states he went back into normal rhythm on his own and remained that way until last night when he felt the atrial fib return.  Patient states heart rate is 140-150 and he feels shaky and sweaty.  Patient states he took Toprol XL 25 mg last night and this morning.  I advised patient that I will talk to Dr. Katrinka Blazing who is in the office today and will call him back with Dr. Michaelle Copas advice.  Patient verbalized agreement and understanding.

## 2013-06-07 NOTE — Telephone Encounter (Signed)
New Problem:  PT states his is having another episode of atrial fib. Pt would like to know if he can come in to have an EKG done. Please advise

## 2013-06-07 NOTE — Telephone Encounter (Signed)
Spoke with Dr. Katrinka Blazing who advised he can see patient today in the office.  Patient states he will be here in 30-45 minutes.  I advised patient of location and scheduled appointment in [facility].

## 2013-06-09 MED ORDER — METOPROLOL SUCCINATE ER 25 MG PO TB24: 50.0000 mg | ORAL_TABLET | Freq: Two times a day (BID) | ORAL | Status: DC

## 2013-06-09 NOTE — Telephone Encounter (Signed)
per Dr.Smith pt advised to increase metoprolol to 50mg  bid. pt is ok to travel this weekent.pt advised to not over exert himself and f/u with a facility if symptoms worsen. pt will call back on Monady with an update of HR and how he is feeling. pt verbalized understanding.

## 2013-06-09 NOTE — Telephone Encounter (Signed)
Pt called today, because his heart rate is still high Afib, 140 to 150 beats per minute as when he was seen in the office 06/07/13. Pt took the Metoprolol 25 mg this AM. Pt would like to know if he can increased  The Metoprolol dose.

## 2013-06-09 NOTE — Addendum Note (Signed)
Addended by: Jarvis Newcomer on: 06/09/2013 10:31 AM   Modules accepted: Orders

## 2013-06-09 NOTE — Telephone Encounter (Signed)
Follow Up  Pt has been in Afib// Was advised to send an update// heart is still beating at 140 beats a min// request a call back to discuss.

## 2013-06-12 ENCOUNTER — Telehealth: Payer: Self-pay | Admitting: Interventional Cardiology

## 2013-06-12 NOTE — Telephone Encounter (Signed)
New Problem//UPDATE  Pt states he has been in Afib for 6 days// was advised to call and give an update// Heart is not beating as fast 85 beats a minute sporadically.  Fast to slow paces. Please call

## 2013-06-12 NOTE — Telephone Encounter (Signed)
pt adv to stay on current dosage of metoprolol 50mg  bid.pt adv to keep upcoming appt with Dr.Allred 06/28/13.

## 2013-06-28 ENCOUNTER — Ambulatory Visit (INDEPENDENT_AMBULATORY_CARE_PROVIDER_SITE_OTHER): Payer: 59 | Admitting: Internal Medicine

## 2013-06-28 ENCOUNTER — Encounter: Payer: Self-pay | Admitting: Internal Medicine

## 2013-06-28 VITALS — BP 126/78 | HR 133 | Ht 71.0 in | Wt 199.1 lb

## 2013-06-28 DIAGNOSIS — I4891 Unspecified atrial fibrillation: Secondary | ICD-10-CM

## 2013-06-28 DIAGNOSIS — I4892 Unspecified atrial flutter: Secondary | ICD-10-CM

## 2013-06-28 DIAGNOSIS — Z7901 Long term (current) use of anticoagulants: Secondary | ICD-10-CM

## 2013-06-28 MED ORDER — FLECAINIDE ACETATE 100 MG PO TABS: 100.0000 mg | ORAL_TABLET | Freq: Two times a day (BID) | ORAL | Status: DC

## 2013-06-28 NOTE — Progress Notes (Signed)
Primary Cardiolgist:  Dr Katrinka Blazing  The patient presents today for electrophysiology followup.  I have not seen him since 2012.  He has done very well s/p ablation and has maintained sinus rhythm until about a month ago when he presented to Dr Katrinka Blazing with atrial flutter with RVR.  He reports fatigue.  This is worse with increased metoprolol.  Today, he denies symptoms of palpitations, chest pain, shortness of breath, orthopnea, PND, lower extremity edema, dizziness, presyncope, syncope, or neurologic sequela.  The patient feels that he is tolerating medications without difficulties and is otherwise without complaint today.   Past Medical History  Diagnosis Date  . Atrial fibrillation     s/p PVI by [facility] 4/12  . PVC's (premature ventricular contractions)   . Hyperlipidemia   . 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 Laparoscopic inguinal hernia repair    . Arthroscopic knee surgury    . Tonsilectomy, adenoidectomy, bilateral myringotomy and tubes    . Basal cell skin cancer removed    . Afib ablation  12/11/10    PVI and [facility]I ablation by [facility]    Current Outpatient Prescriptions  Medication Sig Dispense Refill  . apixaban (ELIQUIS) 5 MG TABS tablet Take 1 tablet (5 mg total) by mouth 2 (two) times daily.  60 tablet  0  . atorvastatin (LIPITOR) 10 MG tablet Take 10 mg by mouth daily.        . metoprolol succinate (TOPROL-XL) 25 MG 24 hr tablet Taking 25 mg in the morning and 50 mg at night      . flecainide (TAMBOCOR) 100 MG tablet Take 1 tablet (100 mg total) by mouth 2 (two) times daily.  180 tablet  3   No current facility-administered medications for this visit.    No Known Allergies  History   Social History  . Marital Status: Married    Spouse Name: N/A    Number of Children: N/A  . Years of Education: N/A   Occupational History  . Not on file.   Social History Main  Topics  . Smoking status: Never Smoker   . Smokeless tobacco: Not on file  . Alcohol Use: No  . Drug Use: No  . Sexual Activity: Not on file   Other Topics Concern  . Not on file   Social History Narrative   He is married and lives in [place].  He has two children, no grandchildren.     He works in Air[employer].    Family History  Problem Relation Age of Onset  . Hypertension Neg Hx   . Hyperlipidemia Neg Hx   . Heart failure Neg Hx   . Heart disease Neg Hx   . Heart attack Neg Hx     ROS-  All systems are reviewed and are negative except as outlined in the HPI above  Physical Exam: Filed Vitals:   06/28/13 1500  BP: 126/78  Pulse: 133  Height: 5\' 11"  (1.803 m)  Weight: 199 lb 1.9 oz (90.32 kg)    GEN- The patient is well appearing, alert and oriented x 3 today.   Head- normocephalic, atraumatic Eyes-  Sclera clear, conjunctiva pink Ears- hearing intact Oropharynx- clear Neck- supple, no JVP Lymph- no cervical lymphadenopathy Lungs- Clear to ausculation bilaterally, normal work of breathing Heart- tachy irregular  rhythm, no murmurs,  GI- soft, NT, ND, + BS Extremities- no clubbing,  cyanosis, or edema MS- no significant deformity or atrophy Skin- no rash or lesion Psych- euthymic mood, full affect Neuro- strength and sensation are intact  ekg today reveals atrial flutter with variable V rate 133 bpm, nonspecific ST/T changes  Assessment and Plan:  1. Atrial flutter The patient presents with atrial flutter x 1 month.  He is appropriate anticoagulated with eliquis. Therapeutic strategies for atrial flutter including medicine, cardioversion, and ablation were discussed in detail with the patient today. Risk, benefits, and alternatives to each strategy including cardioversion, medicine, and  EP study and radiofrequency ablation were also discussed in detail today. These risks include but are not limited to stroke, bleeding, vascular damage, tamponade, perforation,  damage to the heart and other structures, AV block requiring pacemaker, worsening renal function, and death. The patient understands these risk and wishes to try flecainide at this time.  He will therefore start flecainide 100mg  BID  Today.  Continue anticoagulation and rate control Return for ekg in 1 week.  If he remains in afib then we have to consider cardioversion vs ablation at that point.  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  He feels that some how he was given MRSA at [facility] and is reluctant to have further procedures performed there.  I have tried to clarify that he was colonized upon admit and did not receive this colonization as part of his hospitalization.  I dont think he believes me.  2. afib No further afib post ablation  Return to see me in 4 weeks unless he decides to have ablation

## 2013-06-28 NOTE — Patient Instructions (Signed)
Your physician recommends that you schedule a follow-up appointment in: 1 week for an EKG and 1 month with Dr Johney Frame  Your physician has recommended you make the following change in your medication:  1) Start Flecainide 100mg  twice daily

## 2013-06-29 ENCOUNTER — Telehealth: Payer: Self-pay | Admitting: Internal Medicine

## 2013-06-29 ENCOUNTER — Other Ambulatory Visit: Payer: Self-pay | Admitting: *Deleted

## 2013-06-29 DIAGNOSIS — I4891 Unspecified atrial fibrillation: Secondary | ICD-10-CM

## 2013-06-29 MED ORDER — FLECAINIDE ACETATE 100 MG PO TABS: 100.0000 mg | ORAL_TABLET | Freq: Two times a day (BID) | ORAL | Status: DC

## 2013-06-29 NOTE — Telephone Encounter (Signed)
New message    Presc for flecainide cost [facility].00 [facility] to [facility]. Wife want to know if there is something cheaper. However, she is going [facility] and make sure they [facility] the ins correctly.  Want to talk to Kelly--pls call fri when you return.

## 2013-06-30 NOTE — Telephone Encounter (Signed)
Spoke with patient's wife.  For some reason his Rx was sent to [facility] and therefore [facility] will not fill as they state it has already been run through his insurance with them.  He had an old RX that he is taking and will start the new as soon as they come in and may need to push the EKG out to later next week

## 2013-07-02 ENCOUNTER — Emergency Department (HOSPITAL_COMMUNITY): Payer: 59

## 2013-07-02 ENCOUNTER — Encounter (HOSPITAL_COMMUNITY): Payer: Self-pay | Admitting: Emergency Medicine

## 2013-07-02 ENCOUNTER — Other Ambulatory Visit (HOSPITAL_COMMUNITY): Payer: 59

## 2013-07-02 ENCOUNTER — Emergency Department (HOSPITAL_COMMUNITY)
Admission: EM | Admit: 2013-07-02 | Discharge: 2013-07-02 | Disposition: A | Discharge status: Home / Self Care | Payer: 59 | Attending: Emergency Medicine | Admitting: Emergency Medicine

## 2013-07-02 DIAGNOSIS — I4892 Unspecified atrial flutter: Secondary | ICD-10-CM | POA: Insufficient documentation

## 2013-07-02 DIAGNOSIS — R61 Generalized hyperhidrosis: Secondary | ICD-10-CM | POA: Insufficient documentation

## 2013-07-02 DIAGNOSIS — Z79899 Other long term (current) drug therapy: Secondary | ICD-10-CM | POA: Insufficient documentation

## 2013-07-02 DIAGNOSIS — Z9889 Other specified postprocedural states: Secondary | ICD-10-CM | POA: Insufficient documentation

## 2013-07-02 DIAGNOSIS — E785 Hyperlipidemia, unspecified: Secondary | ICD-10-CM | POA: Insufficient documentation

## 2013-07-02 DIAGNOSIS — K5792 Diverticulitis of intestine, part unspecified, without perforation or abscess without bleeding: Secondary | ICD-10-CM

## 2013-07-02 DIAGNOSIS — Z8614 Personal history of Methicillin resistant Staphylococcus aureus infection: Secondary | ICD-10-CM | POA: Insufficient documentation

## 2013-07-02 DIAGNOSIS — K5732 Diverticulitis of large intestine without perforation or abscess without bleeding: Secondary | ICD-10-CM | POA: Insufficient documentation

## 2013-07-02 LAB — POCT I-STAT TROPONIN I
Troponin i, poc: 0.01 ng/mL (ref 0.00–0.08)
Troponin i, poc: 0 ng/mL (ref 0.00–0.08)

## 2013-07-02 LAB — CBC WITH DIFFERENTIAL/PLATELET
Basophils Absolute: 0 10*3/uL (ref 0.0–0.1)
HCT: 45.9 % (ref 39.0–52.0)
Lymphocytes Relative: 15 % (ref 12–46)
Lymphs Abs: 2.3 10*3/uL (ref 0.7–4.0)
MCV: 90.4 fL (ref 78.0–100.0)
Monocytes Absolute: 2 10*3/uL — ABNORMAL HIGH (ref 0.1–1.0)
Neutro Abs: 11 10*3/uL — ABNORMAL HIGH (ref 1.7–7.7)
Platelets: 152 10*3/uL (ref 150–400)
RBC: 5.08 MIL/uL (ref 4.22–5.81)
RDW: 12.8 % (ref 11.5–15.5)
WBC: 15.6 10*3/uL — ABNORMAL HIGH (ref 4.0–10.5)

## 2013-07-02 LAB — CG4 I-STAT (LACTIC ACID): Lactic Acid, Venous: 0.59 mmol/L (ref 0.5–2.2)

## 2013-07-02 LAB — COMPREHENSIVE METABOLIC PANEL
ALT: 69 U/L — ABNORMAL HIGH (ref 0–53)
AST: 33 U/L (ref 0–37)
CO2: 24 mEq/L (ref 19–32)
Chloride: 105 mEq/L (ref 96–112)
GFR calc Af Amer: 87 mL/min — ABNORMAL LOW (ref >90)
GFR calc non Af Amer: 75 mL/min — ABNORMAL LOW (ref >90)
Glucose, Bld: 112 mg/dL — ABNORMAL HIGH (ref 70–99)
Sodium: 138 mEq/L (ref 135–145)
Total Bilirubin: 1.9 mg/dL — ABNORMAL HIGH (ref 0.3–1.2)

## 2013-07-02 LAB — URINALYSIS, ROUTINE W REFLEX MICROSCOPIC
Bilirubin Urine: NEGATIVE
Hgb urine dipstick: NEGATIVE
Ketones, ur: NEGATIVE mg/dL
Protein, ur: NEGATIVE mg/dL
Urobilinogen, UA: 0.2 mg/dL (ref 0.0–1.0)

## 2013-07-02 MED ORDER — MORPHINE SULFATE 4 MG/ML IJ SOLN
4.0000 mg | Freq: Once | INTRAMUSCULAR | Status: AC
  Administered 2013-07-02: 4 mg via INTRAVENOUS
  Filled 2013-07-02: qty 1

## 2013-07-02 MED ORDER — IOHEXOL 300 MG/ML  SOLN
25.0000 mL | INTRAMUSCULAR | Status: AC | PRN
  Administered 2013-07-02 (×2): 25 mL via ORAL

## 2013-07-02 MED ORDER — CIPROFLOXACIN IN D5W 400 MG/200ML IV SOLN
400.0000 mg | Freq: Once | INTRAVENOUS | Status: AC
  Administered 2013-07-02: 400 mg via INTRAVENOUS
  Filled 2013-07-02: qty 200

## 2013-07-02 MED ORDER — METRONIDAZOLE IN NACL 5-0.79 MG/ML-% IV SOLN
500.0000 mg | Freq: Once | INTRAVENOUS | Status: AC
  Administered 2013-07-02: 500 mg via INTRAVENOUS
  Filled 2013-07-02: qty 100

## 2013-07-02 MED ORDER — SODIUM CHLORIDE 0.9 % IV BOLUS (SEPSIS)
1000.0000 mL | Freq: Once | INTRAVENOUS | Status: AC
  Administered 2013-07-02: 1000 mL via INTRAVENOUS

## 2013-07-02 MED ORDER — IOHEXOL 300 MG/ML  SOLN
100.0000 mL | Freq: Once | INTRAMUSCULAR | Status: AC | PRN
  Administered 2013-07-02: 100 mL via INTRAVENOUS

## 2013-07-02 MED ORDER — CIPROFLOXACIN HCL 500 MG PO TABS: 500.0000 mg | ORAL_TABLET | Freq: Two times a day (BID) | ORAL | Status: DC

## 2013-07-02 MED ORDER — METRONIDAZOLE 500 MG PO TABS: 500.0000 mg | ORAL_TABLET | Freq: Three times a day (TID) | ORAL | Status: DC

## 2013-07-02 MED ORDER — METOPROLOL SUCCINATE ER 25 MG PO TB24
25.0000 mg | ORAL_TABLET | Freq: Once | ORAL | Status: AC
  Administered 2013-07-02: 25 mg via ORAL
  Filled 2013-07-02: qty 1

## 2013-07-02 MED ORDER — ATORVASTATIN CALCIUM 10 MG PO TABS
10.0000 mg | ORAL_TABLET | Freq: Every day | ORAL | Status: DC
  Administered 2013-07-02: 10 mg via ORAL
  Filled 2013-07-02: qty 1

## 2013-07-02 MED ORDER — ONDANSETRON HCL 4 MG/2ML IJ SOLN
4.0000 mg | Freq: Once | INTRAMUSCULAR | Status: AC
  Administered 2013-07-02: 4 mg via INTRAVENOUS
  Filled 2013-07-02: qty 2

## 2013-07-02 MED ORDER — HYDROMORPHONE HCL PF 1 MG/ML IJ SOLN: 1.0000 mg | Freq: Once | INTRAMUSCULAR | Status: DC

## 2013-07-02 MED ORDER — APIXABAN 5 MG PO TABS
5.0000 mg | ORAL_TABLET | Freq: Once | ORAL | Status: AC
  Administered 2013-07-02: 5 mg via ORAL
  Filled 2013-07-02: qty 1

## 2013-07-02 MED ORDER — HYDROCODONE-ACETAMINOPHEN 5-325 MG PO TABS: 1.0000 | ORAL_TABLET | Freq: Three times a day (TID) | ORAL | Status: DC | PRN

## 2013-07-02 NOTE — ED Notes (Signed)
Pt states he has a hx of afib/aflutter being treated by dr Fayrene Fearing alrid. Pt was put on Flecamide and Metoprolol. Pt also takes lipitor and Eloquist. Pt states since he started Felcamide he has been feeling lethargic and experiencing sharp pains in abd and sweats.

## 2013-07-02 NOTE — ED Provider Notes (Signed)
CSN: [id number]     Arrival date & time 07/02/13  0359 History   First MD Initiated Contact with Patient 07/02/13 [phone]     Chief Complaint  Patient presents with  . Abdominal Pain   (Consider location/radiation/quality/duration/timing/severity/associated sxs/prior Treatment) The history is provided by the patient. No language interpreter was used.  Douglas Rodriguez is a 62 y/o M with PMHx of afib with 2 ablations performed one in 2012 - PVI and CTI ablation, HLD, currently taking anticoagulation therapy with Eliquis - followed by [facility] - Dr. Tommie Sams - presenting to the ED with abdominal pain, sweats, fatigue. Patient reported that he started Flecainide on Thursday - BID once in the AM and once in the PM - reported that he was instructed by his provider to take this medication in order to see if it can control the Afib that came back. Patient reported that he thinks that the Flecainide is what is causing his symptoms. Reported that on Friday he began to feel sweats, flu-like symptoms, fatigued. Reported that when he walked to get the newspaper he was tired and had to sit down for a bit. Patient reported that he has been experiencing abdominal pain - reported that the pain is localized to the lower abdomen - stated that it is a soreness when resting and a sharp pain with motion with radiation across the lower abdomen. Patient reported that he has stopped taking the Flecainide - reported that he did not take last nights dose. Patient reported that he actually felt better regarding the fatigue, sweating. Patient reported that the abdominal pain continued. Denied chest pain, nausea, vomiting, diarrhea, dizziness, melena, hematochezia, weakness, numbness, light-headedness.  PCP Dr. Marlou Starks Cardiologist Dr. Hillis Range - [place]  Past Medical History  Diagnosis Date  . Atrial fibrillation     s/p PVI by [facility] 4/12  . PVC's (premature ventricular contractions)   . Hyperlipidemia   .  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 Laparoscopic inguinal hernia repair    . Arthroscopic knee surgury    . Tonsilectomy, adenoidectomy, bilateral myringotomy and tubes    . Basal cell skin cancer removed    . Afib ablation  12/11/10    PVI and CTI ablation by [facility]   Family History  Problem Relation Age of Onset  . Hypertension Neg Hx   . Hyperlipidemia Neg Hx   . Heart failure Neg Hx   . Heart disease Neg Hx   . Heart attack Neg Hx    History  Substance Use Topics  . Smoking status: Never Smoker   . Smokeless tobacco: Not on file  . Alcohol Use: No    Review of Systems  Constitutional: Positive for diaphoresis. Negative for fever.  Respiratory: Negative for cough, chest tightness and shortness of breath.   Cardiovascular: Negative for chest pain.  Gastrointestinal: Positive for abdominal pain. Negative for nausea, vomiting, diarrhea, blood in stool and anal bleeding.  Neurological: Negative for dizziness and weakness.  All other systems reviewed and are negative.    Allergies  Review of patient's allergies indicates no known allergies.  Home Medications   Current Outpatient Rx  Name  Route  Sig  Dispense  Refill  . acetaminophen (TYLENOL) 500 MG tablet   Oral   Take 1,000 mg by mouth every 6 (six) hours as needed for pain.         Marland Kitchen  apixaban (ELIQUIS) 5 MG TABS tablet   Oral   Take 1 tablet (5 mg total) by mouth 2 (two) times daily.   60 tablet   0   . ASCORBIC ACID PO   Oral   Take 1 tablet by mouth daily.         Marland Kitchen atorvastatin (LIPITOR) 10 MG tablet   Oral   Take 10 mg by mouth daily.           . flecainide (TAMBOCOR) 100 MG tablet   Oral   Take 1 tablet (100 mg total) by mouth 2 (two) times daily.   180 tablet   3   . metoprolol succinate (TOPROL-XL) 25 MG 24 hr tablet      Taking 25 mg in the morning and 50 mg at night           . Omega-3 Fatty Acids (FISH OIL PO)   Oral   Take 1 tablet by mouth daily.         . ciprofloxacin (CIPRO) 500 MG tablet   Oral   Take 1 tablet (500 mg total) by mouth 2 (two) times daily. One po bid x 10 days   20 tablet   0   . HYDROcodone-acetaminophen (NORCO/VICODIN) 5-325 MG per tablet   Oral   Take 1 tablet by mouth every 8 (eight) hours as needed for pain.   5 tablet   0   . metroNIDAZOLE (FLAGYL) 500 MG tablet   Oral   Take 1 tablet (500 mg total) by mouth 3 (three) times daily.   30 tablet   0    BP 117/80  Pulse 51  Temp(Src) 98 F (36.7 C) (Oral)  Resp 22  SpO2 96% Physical Exam  Nursing note and vitals reviewed. Constitutional: He is oriented to person, place, and time. He appears well-developed and well-nourished. No distress.  HENT:  Head: Normocephalic and atraumatic.  Neck: Normal range of motion. Neck supple.  Cardiovascular: An irregularly irregular rhythm present.  Pulses:      Radial pulses are 2+ on the right side, and 2+ on the left side.       Dorsalis pedis pulses are 2+ on the right side, and 2+ on the left side.  Pulmonary/Chest: Effort normal and breath sounds normal. No respiratory distress. He has no wheezes. He has no rales.  Abdominal: Soft. Bowel sounds are normal. He exhibits no distension. There is tenderness in the left lower quadrant. There is guarding. There is no rigidity, no rebound, no tenderness at McBurney's point and negative Murphy's sign.    Localized to the LLQ with guarding noted  Neurological: He is alert and oriented to person, place, and time. He exhibits normal muscle tone. Coordination normal.  Skin: Skin is warm and dry. No rash noted. He is not diaphoretic. No erythema.  Psychiatric: He has a normal mood and affect. His behavior is normal. Thought content normal.    ED Course  Procedures (including critical care time)  9:08 AM Spoke with patient and wife regarding findings on the CT abdomen and pelvis scan.  Discussed plan with IV antibiotics. Patient and wife understood, agreed to plan - all questions answered.   12:24 PM This provider re-assessed patient. Reported that he is feeling better. Reported that the antibiotics has been helping. Mild discomfort upon palpation to the LLQ upon palpation. Will have patient try fluids and crackers PO to see if tolerate.   2:19 PM This provider spoke with Dr. Dwyane Luo,  from [facility] cardiology - discussed case and findings with physician. Recommended that patient continue to take Flecainide as prescribed.    Date: 07/02/2013  Rate: 16  Rhythm: atrial flutter  QRS Axis: normal  Intervals: normal  ST/T Wave abnormalities: nonspecific T wave changes  Conduction Disutrbances:right bundle branch block  Narrative Interpretation:   Old EKG Reviewed: unchanged EKG analyzed and reviewed by this provider and attending physician.    Labs Review Labs Reviewed  CBC WITH DIFFERENTIAL - Abnormal; Notable for the following:    WBC 15.6 (*)    Neutro Abs 11.0 (*)    Monocytes Relative 13 (*)    Monocytes Absolute 2.0 (*)    All other components within normal limits  COMPREHENSIVE METABOLIC PANEL - Abnormal; Notable for the following:    Glucose, Bld 112 (*)    ALT 69 (*)    Total Bilirubin 1.9 (*)    GFR calc non Af Amer 75 (*)    GFR calc Af Amer 87 (*)    All other components within normal limits  URINALYSIS, ROUTINE W REFLEX MICROSCOPIC - Abnormal; Notable for the following:    APPearance CLOUDY (*)    All other components within normal limits  LIPASE, BLOOD  CG4 I-STAT (LACTIC ACID)  POCT I-STAT TROPONIN I  POCT I-STAT TROPONIN I   Imaging Review Ct Abdomen Pelvis W Contrast  07/02/2013   CLINICAL DATA:  Abdominal pain and lethargy  EXAM: CT ABDOMEN AND PELVIS WITH CONTRAST  TECHNIQUE: Multidetector CT imaging of the abdomen and pelvis was performed using the standard protocol following bolus administration of intravenous contrast. Oral contrast was  also administered.  CONTRAST:  OMNIPAQUE IOHEXOL 300 MG/ML  SOLN  COMPARISON:  None.  FINDINGS: There is mild bibasilar atelectasis. Lung bases are otherwise clear. Heart is mildly enlarged.  There is a 1.4 by 1.3 cm cyst in the medial segment of the left lobe of the liver. There is a 5 mm cyst in the medial aspect of the anterior segment of the right lobe of the liver. No other liver lesions are identified on this study. There is no biliary duct dilatation. Gallbladder wall does not appear thickened.  Spleen, pancreas, and adrenals appear normal. Kidneys bilaterally show no appreciable mass or hydronephrosis on either side. There is a prominent column of Bertin on the left, an anatomic variant. There is no ureteral calculus or ureterectasis on either side.  In the pelvis, there is extensive of inflammation of the colon at the junction of the descending colon and sigmoid colon. In this area, there is wall thickening and mesenteric inflammation consistent with localized diverticulitis. There is no appreciable free air in this area. There is wall thickening throughout much of the sigmoid colon.  There is no pelvic mass or fluid collection. There are a few small mildly prominent pelvic lymph nodes which may be reactive due to the diverticulitis. Appendix appears normal.  There is no bowel obstruction. No free air or portal venous air.  There is no ascites, adenopathy, or abscess in the abdomen or pelvis. There is atherosclerotic change in the aorta but no aneurysm. There are no blastic or lytic bone lesions.  IMPRESSION: There is diverticulitis in the proximal sigmoid colon. No frank abscess. There is sigmoid colon wall thickening with pericolonic mesenteric inflammation in the proximal sigmoid region.  No bowel obstruction. No frank abscess in the abdomen or pelvis. Appendix appears normal. The  There are small liver cysts.   Electronically Signed  By: Bretta Bang M.D.   On: 07/02/2013 08:42    EKG  Interpretation   None       MDM   1. Diverticulitis   2. Atrial flutter     Patient presenting to emergency department with abdominal pain is been ongoing since Friday. Patient has history of atrial fib with 2 ablations, patient was started on flecainide on Thursday 2 times a day by mouth. Patient reported that the symptoms started shortly after taking flecainide-reported that he did not take any yesterday. Reported that his fatigue has improved, reported the abdominal pain is still present. Alert and oriented. Irregular irregular rhythm with mild tachycardia identified. Radial and DP pulses 2+ bilaterally, strong palpable. Bowel sounds normoactive in all 4 quadrants, soft. Pain upon palpation to left lower quadrant, positive guarding identified. Negative findings of diaphoresis. Lactic acid negative elevation. Lipase negative elevation. Two sets of Troponin negative elevation. Urine negative for infection. CBC mild elevation white blood cell count-15.6 with mild leukocytosis identified. CMP negative findings. CT abdomen and pelvis with contrast identified diverticulitis in the proximal sigmoid colon with no abscess identified-sigmoid colon wall thickening with pericolonic mesenteric inflammation noted in the proximal sigmoid region. Negative bowel structure identified. No findings of abscess in the abdomen or pelvis identified. Appendix is normal. Patient treated in the ED for diverticulitis. Patient given IV flagyl and cipro, IV fluids administered. Patient able to tolerate fluids and food PO without nausea or emesis. Patient's stayed in aflutter while in ED setting with heart rate elevated - highest was 119 bpm. Patient able to walk and stand without feeling dizzy. Patient reported that he has been feeling better. Discussed case, labs, findings, imaging, vitals with attending physician who agreed to plan of discharge.  This provider spoke with cardiology regarding case, history of patient, and  flecainide - recommended that patient continue the flecainide as prescribed and to follow up as outpatient with cardiologist.  Patient stable, afebrile. Tolerated fluids and food PO. Discharged patient with Flagyl and Cipro. Discharged patient with small dose of pain medications as discussed with patient course, precautions and disposal technique. Discussed with patient to rest and stay hydrated gastric drink plenty of fluids. Referred patient to primary care provider, cardiologist, gastroenterology. Discussed with patient proper diet. Discussed with patient to avoid any fatty greasy foods. Discussed with patient educated patient on what symptoms to watch out for regarding worsening symptoms. Discussed with patient to closely monitor symptoms if symptoms are to worsen or change to provide to emergency department gastric return instructions given. Patient agreed to plan of care, understood, all questions answered.    Raymon Mutton, PA-C 07/03/13 1516

## 2013-07-02 NOTE — ED Notes (Addendum)
Pt c/o left sided lower quadrant abdominal pain x 2 days, and flu like symptoms

## 2013-07-02 NOTE — ED Notes (Signed)
Patient transported to CT 

## 2013-07-03 ENCOUNTER — Telehealth: Payer: Self-pay | Admitting: Internal Medicine

## 2013-07-03 NOTE — Telephone Encounter (Signed)
New Problem  Pt states he was recently in the Hospital and he wanted Dr. Johney Frame to be aware//Request a call back if needed.

## 2013-07-03 NOTE — ED Provider Notes (Signed)
Medical screening examination/treatment/procedure(s) were performed by non-physician practitioner and as supervising physician I was immediately available for consultation/collaboration.  EKG Interpretation   None        Calem Cocozza, MD 07/03/13 2242 

## 2013-07-05 ENCOUNTER — Ambulatory Visit (INDEPENDENT_AMBULATORY_CARE_PROVIDER_SITE_OTHER): Payer: 59

## 2013-07-05 VITALS — BP 122/95 | HR 129 | Ht 71.0 in | Wt 195.4 lb

## 2013-07-05 DIAGNOSIS — I4891 Unspecified atrial fibrillation: Secondary | ICD-10-CM

## 2013-07-05 MED ORDER — METOPROLOL SUCCINATE ER 50 MG PO TB24: ORAL_TABLET | ORAL | Status: DC

## 2013-07-05 NOTE — Progress Notes (Signed)
Patient had Nurse visit today for a ekg.Patient stated he started on Flecainide 100 mg twice a day on 06/28/13.Stated on 06/29/13 he started feeling bad,flu like symptoms,abdominal pain,sweats,sob.Stated he stopped Flecainide 07/01/13 and went to [facility] ER 07/02/13 was told he had diverticulitis,antibiotics started.Stated he was feeling better.Stated he did not want to take Flecainide.EKG was done and revealed atrial fib rate 129 beats/min.EKG was shown to DOD Dr.Cooper he advised to increase Metoprolol 50 mg twice a day.Advised to give EKG to [facility] for Dr.Allred to review.

## 2013-07-06 ENCOUNTER — Encounter: Payer: Self-pay | Admitting: *Deleted

## 2013-07-06 ENCOUNTER — Ambulatory Visit (INDEPENDENT_AMBULATORY_CARE_PROVIDER_SITE_OTHER): Payer: 59 | Admitting: Internal Medicine

## 2013-07-06 ENCOUNTER — Encounter: Payer: Self-pay | Admitting: Internal Medicine

## 2013-07-06 VITALS — BP 120/70 | HR 60 | Ht 71.0 in | Wt 196.1 lb

## 2013-07-06 DIAGNOSIS — I4891 Unspecified atrial fibrillation: Secondary | ICD-10-CM

## 2013-07-06 DIAGNOSIS — Z7901 Long term (current) use of anticoagulants: Secondary | ICD-10-CM

## 2013-07-06 DIAGNOSIS — I4892 Unspecified atrial flutter: Secondary | ICD-10-CM

## 2013-07-06 NOTE — Patient Instructions (Signed)

## 2013-07-07 ENCOUNTER — Other Ambulatory Visit: Payer: Self-pay

## 2013-07-07 DIAGNOSIS — I4891 Unspecified atrial fibrillation: Secondary | ICD-10-CM

## 2013-07-07 LAB — CBC WITH DIFFERENTIAL/PLATELET
Basophils Absolute: 0 10*3/uL (ref 0.0–0.1)
Basophils Relative: 0.3 % (ref 0.0–3.0)
Eosinophils Absolute: 0.3 10*3/uL (ref 0.0–0.7)
Hemoglobin: 16.3 g/dL (ref 13.0–17.0)
Lymphocytes Relative: 27.7 % (ref 12.0–46.0)
MCHC: 33.9 g/dL (ref 30.0–36.0)
MCV: 91.5 fl (ref 78.0–100.0)
Monocytes Absolute: 1 10*3/uL (ref 0.1–1.0)
Monocytes Relative: 9.5 % (ref 3.0–12.0)
Neutro Abs: 6.5 10*3/uL (ref 1.4–7.7)
Neutrophils Relative %: 59.6 % (ref 43.0–77.0)
Platelets: 188 10*3/uL (ref 150.0–400.0)
RDW: 12.8 % (ref 11.5–14.6)

## 2013-07-07 LAB — BASIC METABOLIC PANEL
BUN: 17 mg/dL (ref 6–23)
CO2: 28 mEq/L (ref 19–32)
Calcium: 8.9 mg/dL (ref 8.4–10.5)
Chloride: 104 mEq/L (ref 96–112)
Creatinine, Ser: 1.1 mg/dL (ref 0.4–1.5)
GFR: 70.82 mL/min (ref >60.00)
Glucose, Bld: 98 mg/dL (ref 70–99)

## 2013-07-07 MED ORDER — APIXABAN 5 MG PO TABS: 5.0000 mg | ORAL_TABLET | Freq: Two times a day (BID) | ORAL | Status: DC

## 2013-07-10 NOTE — Progress Notes (Signed)
Primary Cardiolgist:  Dr Smith  The patient presents today for electrophysiology followup.  Upon his recent visit to my office, he was started on flecainide for his atrial flutter.  He reports that his fatigue worsened with this therapy.  In addition, he developed diverticulitis several days later.  He attributes his diverticulitis to flecainide.  He has therefore stopped this medicine and therefore remains in atrial flutter today.  Today, he denies symptoms of palpitations, chest pain, shortness of breath, orthopnea, PND, lower extremity edema, dizziness, presyncope, syncope, or neurologic sequela.  The patient feels that he is tolerating medications without difficulties and is otherwise without complaint today.   Past Medical History  Diagnosis Date  . Atrial fibrillation     s/p PVI by JA 4/12  . PVC's (premature ventricular contractions)   . Hyperlipidemia   . 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 Laparoscopic inguinal hernia repair    . Arthroscopic knee surgury    . Tonsilectomy, adenoidectomy, bilateral myringotomy and tubes    . Basal cell skin cancer removed    . Afib ablation  12/11/10    PVI and CTI ablation by JA    Current Outpatient Prescriptions  Medication Sig Dispense Refill  . acetaminophen (TYLENOL) 500 MG tablet Take 1,000 mg by mouth every 6 (six) hours as needed for pain.      . atorvastatin (LIPITOR) 10 MG tablet Take 10 mg by mouth daily.        . ciprofloxacin (CIPRO) 500 MG tablet Take 1 tablet (500 mg total) by mouth 2 (two) times daily. One po bid x 10 days  20 tablet  0  . metoprolol succinate (TOPROL-XL) 50 MG 24 hr tablet Take 50 mg twice a day.  60 tablet  6  . metroNIDAZOLE (FLAGYL) 500 MG tablet Take 1 tablet (500 mg total) by mouth 3 (three) times daily.  30 tablet  0  . apixaban (ELIQUIS) 5 MG TABS tablet Take 1 tablet (5 mg total) by mouth 2  (two) times daily.  180 tablet  3  . Omega-3 Fatty Acids (FISH OIL PO) Take 1 tablet by mouth daily.       No current facility-administered medications for this visit.    No Known Allergies  History   Social History  . Marital Status: Married    Spouse Name: N/A    Number of Children: N/A  . Years of Education: N/A   Occupational History  . Not on file.   Social History Main Topics  . Smoking status: Never Smoker   . Smokeless tobacco: Not on file  . Alcohol Use: No  . Drug Use: No  . Sexual Activity: Not on file   Other Topics Concern  . Not on file   Social History Narrative   He is married and lives in [place].  He has two children, no grandchildren.     He works in sales.    Family History  Problem Relation Age of Onset  . Hypertension Neg Hx   . Hyperlipidemia Neg Hx   . Heart failure Neg Hx   . Heart disease Neg Hx   . Heart attack Neg Hx     Physical Exam: Filed Vitals:   07/06/13 1542  BP: 120/70  Pulse: 60  Height: 5' 11" (1.803 m)  Weight: 196 lb 1.9 oz (88.959 kg)    GEN- The patient   is well appearing, alert and oriented x 3 today.   Head- normocephalic, atraumatic Eyes-  Sclera clear, conjunctiva pink Ears- hearing intact Oropharynx- clear Neck- supple, no JVP Lymph- no cervical lymphadenopathy Lungs- Clear to ausculation bilaterally, normal work of breathing Heart- tachy irregular  rhythm, no murmurs,  GI- soft, NT, ND, + BS Extremities- no clubbing, cyanosis, or edema MS- no significant deformity or atrophy Skin- no rash or lesion Psych- euthymic mood, full affect Neuro- strength and sensation are intact  ekg 07/05/13 reveals atrial flutter with variable V rate 129 bpm, nonspecific ST/T changes  Assessment and Plan:  1. Atrial flutter The patient presents with atrial flutter and symptoms of fatigue.  He is appropriate anticoagulated with eliquis.  He has failed medical therapy with flecainide.  I have tried to reassure him  today that his diverticulitis was not caused by flecainide. Therapeutic strategies for atrial flutter including medicine, cardioversion, and ablation were discussed in detail with the patient and his wife again today. Risk, benefits, and alternatives to each strategy including cardioversion, medicine, and  EP study and radiofrequency ablation were also discussed in detail today. These risks of ablation include but are not limited to stroke, bleeding, vascular damage, tamponade, perforation, damage to the heart and other structures, AV block requiring pacemaker, worsening renal function, and death. The patient understands these risk and wishes to proceed.  We will schedule ablation at the next available time.  2. afib No further afib post ablation  

## 2013-07-20 ENCOUNTER — Encounter (HOSPITAL_COMMUNITY): Payer: Self-pay | Admitting: *Deleted

## 2013-07-20 ENCOUNTER — Ambulatory Visit (HOSPITAL_COMMUNITY)
Admission: RE | Admit: 2013-07-20 | Discharge: 2013-07-20 | Disposition: A | Discharge status: Home / Self Care | Payer: 59 | Source: Ambulatory Visit | Attending: Internal Medicine | Admitting: Internal Medicine

## 2013-07-20 ENCOUNTER — Encounter (HOSPITAL_COMMUNITY)
Admission: RE | Disposition: A | Discharge status: Home / Self Care | Payer: Self-pay | Source: Ambulatory Visit | Attending: Internal Medicine

## 2013-07-20 ENCOUNTER — Encounter (HOSPITAL_COMMUNITY): Payer: 59 | Admitting: Certified Registered"

## 2013-07-20 ENCOUNTER — Ambulatory Visit (HOSPITAL_COMMUNITY): Payer: 59 | Admitting: Certified Registered"

## 2013-07-20 DIAGNOSIS — I4892 Unspecified atrial flutter: Secondary | ICD-10-CM | POA: Insufficient documentation

## 2013-07-20 DIAGNOSIS — Z8679 Personal history of other diseases of the circulatory system: Secondary | ICD-10-CM

## 2013-07-20 DIAGNOSIS — Z7901 Long term (current) use of anticoagulants: Secondary | ICD-10-CM

## 2013-07-20 DIAGNOSIS — I495 Sick sinus syndrome: Secondary | ICD-10-CM

## 2013-07-20 DIAGNOSIS — I4891 Unspecified atrial fibrillation: Secondary | ICD-10-CM

## 2013-07-20 HISTORY — DX: Reserved for concepts with insufficient information to code with codable children: IMO0002

## 2013-07-20 HISTORY — DX: Basal cell carcinoma of skin, unspecified: C44.91

## 2013-07-20 HISTORY — PX: ATRIAL FLUTTER ABLATION: SHX5733

## 2013-07-20 SURGERY — ATRIAL FLUTTER ABLATION
Anesthesia: General

## 2013-07-20 MED ORDER — LIDOCAINE HCL (CARDIAC) 20 MG/ML IV SOLN
INTRAVENOUS | Status: DC | PRN
  Administered 2013-07-20: 80 mg via INTRAVENOUS

## 2013-07-20 MED ORDER — HYDROCODONE-ACETAMINOPHEN 5-325 MG PO TABS: 1.0000 | ORAL_TABLET | ORAL | Status: DC | PRN

## 2013-07-20 MED ORDER — ONDANSETRON HCL 4 MG/2ML IJ SOLN: 4.0000 mg | Freq: Four times a day (QID) | INTRAMUSCULAR | Status: DC | PRN

## 2013-07-20 MED ORDER — OXYCODONE HCL 5 MG/5ML PO SOLN: 5.0000 mg | Freq: Once | ORAL | Status: DC | PRN

## 2013-07-20 MED ORDER — OXYCODONE HCL 5 MG PO TABS: 5.0000 mg | ORAL_TABLET | Freq: Once | ORAL | Status: DC | PRN

## 2013-07-20 MED ORDER — SODIUM CHLORIDE 0.9 % IV SOLN
INTRAVENOUS | Status: DC | PRN
  Administered 2013-07-20: 12:00:00 via INTRAVENOUS

## 2013-07-20 MED ORDER — ONDANSETRON HCL 4 MG/2ML IJ SOLN
INTRAMUSCULAR | Status: DC | PRN
  Administered 2013-07-20: 4 mg via INTRAVENOUS

## 2013-07-20 MED ORDER — METOPROLOL SUCCINATE ER 50 MG PO TB24: ORAL_TABLET | ORAL | Status: DC

## 2013-07-20 MED ORDER — OFF THE BEAT BOOK
Freq: Once | Status: AC
  Administered 2013-07-20: 21:00:00
  Filled 2013-07-20: qty 1

## 2013-07-20 MED ORDER — PROPOFOL 10 MG/ML IV BOLUS
INTRAVENOUS | Status: DC | PRN
  Administered 2013-07-20: 180 mg via INTRAVENOUS

## 2013-07-20 MED ORDER — SODIUM CHLORIDE 0.9 % IJ SOLN: 3.0000 mL | Freq: Two times a day (BID) | INTRAMUSCULAR | Status: DC

## 2013-07-20 MED ORDER — FENTANYL CITRATE 0.05 MG/ML IJ SOLN: 25.0000 ug | INTRAMUSCULAR | Status: DC | PRN

## 2013-07-20 MED ORDER — PHENYLEPHRINE HCL 10 MG/ML IJ SOLN
INTRAMUSCULAR | Status: DC | PRN
  Administered 2013-07-20 (×2): 160 ug via INTRAVENOUS
  Administered 2013-07-20: 80 ug via INTRAVENOUS

## 2013-07-20 MED ORDER — SODIUM CHLORIDE 0.9 % IJ SOLN: 3.0000 mL | INTRAMUSCULAR | Status: DC | PRN

## 2013-07-20 MED ORDER — APIXABAN 5 MG PO TABS
5.0000 mg | ORAL_TABLET | Freq: Two times a day (BID) | ORAL | Status: DC
  Administered 2013-07-20: 19:00:00 5 mg via ORAL
  Filled 2013-07-20: qty 1

## 2013-07-20 MED ORDER — MIDAZOLAM HCL 5 MG/5ML IJ SOLN
INTRAMUSCULAR | Status: DC | PRN
  Administered 2013-07-20: 2 mg via INTRAVENOUS

## 2013-07-20 MED ORDER — BUPIVACAINE HCL (PF) 0.25 % IJ SOLN
INTRAMUSCULAR | Status: AC
  Filled 2013-07-20: qty 30

## 2013-07-20 MED ORDER — FENTANYL CITRATE 0.05 MG/ML IJ SOLN
INTRAMUSCULAR | Status: DC | PRN
  Administered 2013-07-20: 50 ug via INTRAVENOUS

## 2013-07-20 MED ORDER — SODIUM CHLORIDE 0.9 % IV SOLN: 250.0000 mL | INTRAVENOUS | Status: DC | PRN

## 2013-07-20 MED ORDER — ACETAMINOPHEN 325 MG PO TABS: 650.0000 mg | ORAL_TABLET | ORAL | Status: DC | PRN

## 2013-07-20 MED ORDER — EPHEDRINE SULFATE 50 MG/ML IJ SOLN
INTRAMUSCULAR | Status: DC | PRN
  Administered 2013-07-20: 10 mg via INTRAVENOUS

## 2013-07-20 MED ORDER — ARTIFICIAL TEARS OP OINT
TOPICAL_OINTMENT | OPHTHALMIC | Status: DC | PRN
  Administered 2013-07-20: 1 via OPHTHALMIC

## 2013-07-20 NOTE — Preoperative (Signed)
Beta Blockers   Reason not to administer Beta Blockers:Not Applicable 

## 2013-07-20 NOTE — Progress Notes (Signed)
Pt discharged to home, VSS as charted, pt in no distress, right groin site D/I, soft, no hematoma, pt left in care of wife, with belongings

## 2013-07-20 NOTE — Transfer of Care (Signed)
Immediate Anesthesia Transfer of Care Note  Patient: Douglas Rodriguez  Procedure(s) Performed: Procedure(s): ATRIAL FLUTTER ABLATION (N/A)  Patient Location: PACU  Anesthesia Type:General  Level of Consciousness: awake, alert  and oriented  Airway & Oxygen Therapy: Patient Spontanous Breathing and Patient connected to nasal cannula oxygen  Post-op Assessment: Report given to PACU RN  Post vital signs: Reviewed and stable  Complications: No apparent anesthesia complications

## 2013-07-20 NOTE — Interval H&P Note (Signed)
History and Physical Interval Note:  07/20/2013 11:50 AM  Douglas Rodriguez  has presented today for surgery, with the diagnosis of Aflutter  The various methods of treatment have been discussed with the patient and family. After consideration of risks, benefits and other options for treatment, the patient has consented to  Procedure(s): ATRIAL FLUTTER ABLATION (N/A) as a surgical intervention .  The patient's history has been reviewed, patient examined, no change in status, stable for surgery.  I have reviewed the patient's chart and labs.  Questions were answered to the patient's satisfaction.     Hillis Range

## 2013-07-20 NOTE — Op Note (Signed)
SURGEON:  Hillis Range, MD      PREPROCEDURE DIAGNOSIS:  Atrial flutter.      POSTPROCEDURE DIAGNOSIS:  Isthmus-dependent counter clockwise right atrial flutter.      PROCEDURES:   1. Comprehensive EP study.   2. Coronary sinus pacing and recording.   3. 3D Mapping of supraventricular tachycardia.   4. Radiofrequency ablation of supraventricular tachycardia.      INTRODUCTION: Douglas Rodriguez is a 62 y.o. male with a history of typical appearing atrial flutter who presents today for EP study and radiofrequency ablation.  The patient recently developed symptoms of weakness and fatigue for which he was found to have atrial flutter with elevated ventricular rates.  He is s/p prior afib ablation 2012. The patient remains symptomatic despite rate control.  He has not tolerated flecainide.  The patient has been adequately anticoagulated for three weeks and now presents for EP study and radiofrequency ablation of atrial flutter.      DESCRIPTION OF PROCEDURE:  Informed written consent was obtained and the patient was brought to the Electrophysiology Lab in the fasting state. The patient was very clear that she had been compliant with eliquis 5mg  b.i.d. over the past 4 weeks without interruption.  The patient was adequately sedated with intravenous medication as outlined in the anesthesia report.  The patient's right groin was prepped and draped in the usual sterile fashion by the EP Lab staff.  Using a percutaneous Seldinger technique, two 7-French and one 8-French hemostasis sheaths were placed into the right common femoral vein.  A 7-French Biosense Webster decapolar catheter was introduced through the right common femoral vein and advanced into the coronary sinus for recording and pacing from this location.  A 6-French quadripolar Josephson catheter was introduced through the right common femoral vein and advanced into the right ventricle for recording and pacing.  This catheter was then pulled back to  the His bundle location.    Presenting Measurements: The patient presented to the Electrophysiology Lab in atrial flutter.  The surface electrocardiogram was consistent with typical atrial flutter.  The atrial flutter cycle length was 220 milliseconds.  The coronary sinus catheter activation revealed proximal to distal activation and was therefore suggestive of right atrial flutter.  The patient's QRS duration was 70 milliseconds with a QT interval of 380 milliseconds and an HV interval of 36 milliseconds.   Entrainment and Mapping: Entrainment was performed from the left atrium, which revealed a long postpacing interval.  A Biosense Webster EZ steer 8-mm ablation catheter was introduced through the right common femoral vein and advanced into the right atrium.  The catheter was positioned along the cavotricuspid isthmus.  Entrainment mapping was performed from the cavotricuspid isthmus.  The postpacing interval was equal to the tachycardia cycle length when pacing in this location during entrainment.  The patient was therefore felt to have isthmus-dependent right atrial flutter.  3D Mapping was performed throughout the right atrium.  More than 85% of the tachycardia cycle length was mapped from the right atrium.  The activation map demonstrated counterclockwise reentry around the tricuspid valve annulus.  I therefore elected to perform cavotricuspid isthmus ablation today.   Ablation: The ablation catheter was therefore positioned along the cavotricuspid isthmus and a series of radiofrequency applications were delivered with a target temperature of 60 degrees of 60 watts.  The tachycardia slowed and then terminated during radiofrequency application. Additional radiofrequency lesions were delivered as bonus lesions along the isthmus.  A 7-French Biosense Webster dual decapolar halo  catheter was introduced through the right common femoral vein and advanced into the right atrium.  This catheter was positioned  around the tricuspid valve annulus.  This demonstrated complete bidirectional isthmus block as evident by differential atrial pacing from the low lateral right atrium.  The stimulus to earliest activation across the isthmus bidirectionally measured 150 msec.  The patient was observed for 20 minutes without return of conduction through the cavotricuspid isthmus.    Measurements following ablation: Following ablation, the AH interval measured 92 milliseconds with an HV interval of 42 milliseconds.  Atrial pacing was performed, which revealed decremental AV conduction with no evidence of PR greater than RR.  The AV Wenckebach cycle length was 290 milliseconds.  Atrial pacing was continued down to a cycle length of 200 milliseconds with no arrhythmias induced.  Ventricular pacing was performed, which revealed midline decremental VA conduction with a retrograde VA block of 330 milliseconds.  No arrhythmias were induced. The procedure was therefore considered completed.  All catheters were removed and the sheaths were aspirated and flushed.  The sheaths were removed and hemostasis was assured.  There were no early apparent complications.      CONCLUSIONS:   1. Isthmus-dependent counter clockwise right atrial flutter upon presentation.   2. Successful radiofrequency ablation of atrial flutter along the cavotricuspid isthmus with complete bidirectional isthmus block achieved.   3. No inducible arrhythmias following ablation.   4. No early apparent complications.

## 2013-07-20 NOTE — Anesthesia Postprocedure Evaluation (Signed)
  Anesthesia Post-op Note  Patient: Douglas Rodriguez  Procedure(s) Performed: Procedure(s): ATRIAL FLUTTER ABLATION (N/A)  Patient Location: Cath Lab  Anesthesia Type:General  Level of Consciousness: awake, alert  and oriented  Airway and Oxygen Therapy: Patient Spontanous Breathing and Patient connected to nasal cannula oxygen  Post-op Pain: mild  Post-op Assessment: Post-op Vital signs reviewed, Patient's Cardiovascular Status Stable, Respiratory Function Stable, Patent Airway and Pain level controlled  Post-op Vital Signs: stable  Complications: No apparent anesthesia complications

## 2013-07-20 NOTE — Anesthesia Procedure Notes (Signed)
Procedure Name: LMA Insertion Date/Time: 07/20/2013 12:38 PM Performed by: Jefm Miles E Pre-anesthesia Checklist: Patient identified, Emergency Drugs available, Suction available, Patient being monitored and Timeout performed Patient Re-evaluated:Patient Re-evaluated prior to inductionOxygen Delivery Method: Circle system utilized Preoxygenation: Pre-oxygenation with 100% oxygen Intubation Type: IV induction Ventilation: Mask ventilation without difficulty LMA: LMA inserted LMA Size: 5.0 Number of attempts: 1 Placement Confirmation: positive ETCO2 and breath sounds checked- equal and bilateral Tube secured with: Tape Dental Injury: Teeth and Oropharynx as per pre-operative assessment

## 2013-07-20 NOTE — Anesthesia Preprocedure Evaluation (Addendum)
Anesthesia Evaluation  Patient identified by MRN, date of birth, ID band Patient awake    Reviewed: Allergy & Precautions, H&P , NPO status , Patient's Chart, lab work & pertinent test results  Airway Mallampati: II TM Distance: >3 FB Neck ROM: full    Dental  (+) Teeth Intact and Dental Advisory Given   Pulmonary neg pulmonary ROS,          Cardiovascular + dysrhythmias Atrial Fibrillation     Neuro/Psych    GI/Hepatic   Endo/Other    Renal/GU      Musculoskeletal   Abdominal   Peds  Hematology   Anesthesia Other Findings   Reproductive/Obstetrics                          Anesthesia Physical Anesthesia Plan  ASA: II  Anesthesia Plan: General   Post-op Pain Management:    Induction: Intravenous  Airway Management Planned: LMA  Additional Equipment:   Intra-op Plan:   Post-operative Plan:   Informed Consent: I have reviewed the patients History and Physical, chart, labs and discussed the procedure including the risks, benefits and alternatives for the proposed anesthesia with the patient or authorized representative who has indicated his/her understanding and acceptance.     Plan Discussed with: CRNA, Anesthesiologist and Surgeon  Anesthesia Plan Comments:         Anesthesia Quick Evaluation

## 2013-07-20 NOTE — H&P (View-Only) (Signed)
Primary Cardiolgist:  Dr Katrinka Blazing  The patient presents today for electrophysiology followup.  Upon his recent visit to my office, he was started on flecainide for his atrial flutter.  He reports that his fatigue worsened with this therapy.  In addition, he developed diverticulitis several days later.  He attributes his diverticulitis to flecainide.  He has therefore stopped this medicine and therefore remains in atrial flutter today.  Today, he denies symptoms of palpitations, chest pain, shortness of breath, orthopnea, PND, lower extremity edema, dizziness, presyncope, syncope, or neurologic sequela.  The patient feels that he is tolerating medications without difficulties and is otherwise without complaint today.   Past Medical History  Diagnosis Date  . Atrial fibrillation     s/p PVI by JA 4/12  . PVC's (premature ventricular contractions)   . Hyperlipidemia   . 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 Laparoscopic inguinal hernia repair    . Arthroscopic knee surgury    . Tonsilectomy, adenoidectomy, bilateral myringotomy and tubes    . Basal cell skin cancer removed    . Afib ablation  12/11/10    PVI and CTI ablation by [facility]    Current Outpatient Prescriptions  Medication Sig Dispense Refill  . acetaminophen (TYLENOL) 500 MG tablet Take 1,000 mg by mouth every 6 (six) hours as needed for pain.      Marland Kitchen atorvastatin (LIPITOR) 10 MG tablet Take 10 mg by mouth daily.        . ciprofloxacin (CIPRO) 500 MG tablet Take 1 tablet (500 mg total) by mouth 2 (two) times daily. One po bid x 10 days  20 tablet  0  . metoprolol succinate (TOPROL-XL) 50 MG 24 hr tablet Take 50 mg twice a day.  60 tablet  6  . metroNIDAZOLE (FLAGYL) 500 MG tablet Take 1 tablet (500 mg total) by mouth 3 (three) times daily.  30 tablet  0  . apixaban (ELIQUIS) 5 MG TABS tablet Take 1 tablet (5 mg total) by mouth 2  (two) times daily.  180 tablet  3  . Omega-3 Fatty Acids (FISH OIL PO) Take 1 tablet by mouth daily.       No current facility-administered medications for this visit.    No Known Allergies  History   Social History  . Marital Status: Married    Spouse Name: N/A    Number of Children: N/A  . Years of Education: N/A   Occupational History  . Not on file.   Social History Main Topics  . Smoking status: Never Smoker   . Smokeless tobacco: Not on file  . Alcohol Use: No  . Drug Use: No  . Sexual Activity: Not on file   Other Topics Concern  . Not on file   Social History Narrative   He is married and lives in [place].  He has two children, no grandchildren.     He works in Airline [employer].    Family History  Problem Relation Age of Onset  . Hypertension Neg Hx   . Hyperlipidemia Neg Hx   . Heart failure Neg Hx   . Heart disease Neg Hx   . Heart attack Neg Hx     Physical Exam: Filed Vitals:   07/06/13 1542  BP: 120/70  Pulse: 60  Height: 5\' 11"  (1.803 m)  Weight: 196 lb 1.9 oz (88.959 kg)    GEN- The patient  is well appearing, alert and oriented x 3 today.   Head- normocephalic, atraumatic Eyes-  Sclera clear, conjunctiva pink Ears- hearing intact Oropharynx- clear Neck- supple, no JVP Lymph- no cervical lymphadenopathy Lungs- Clear to ausculation bilaterally, normal work of breathing Heart- tachy irregular  rhythm, no murmurs,  GI- soft, NT, ND, + BS Extremities- no clubbing, cyanosis, or edema MS- no significant deformity or atrophy Skin- no rash or lesion Psych- euthymic mood, full affect Neuro- strength and sensation are intact  ekg 07/05/13 reveals atrial flutter with variable V rate 129 bpm, nonspecific ST/T changes  Assessment and Plan:  1. Atrial flutter The patient presents with atrial flutter and symptoms of fatigue.  He is appropriate anticoagulated with eliquis.  He has failed medical therapy with flecainide.  I have tried to reassure him  today that his diverticulitis was not caused by flecainide. Therapeutic strategies for atrial flutter including medicine, cardioversion, and ablation were discussed in detail with the patient and his wife again today. Risk, benefits, and alternatives to each strategy including cardioversion, medicine, and  EP study and radiofrequency ablation were also discussed in detail today. These risks of ablation include but are not limited to stroke, bleeding, vascular damage, tamponade, perforation, damage to the heart and other structures, AV block requiring pacemaker, worsening renal function, and death. The patient understands these risk and wishes to proceed.  We will schedule ablation at the next available time.  2. afib No further afib post ablation

## 2013-08-04 ENCOUNTER — Ambulatory Visit: Payer: 59 | Admitting: Internal Medicine

## 2013-08-25 ENCOUNTER — Ambulatory Visit: Payer: 59 | Admitting: Internal Medicine

## 2013-09-11 ENCOUNTER — Telehealth: Payer: Self-pay | Admitting: *Deleted

## 2013-09-11 ENCOUNTER — Telehealth: Payer: Self-pay | Admitting: Internal Medicine

## 2013-09-11 ENCOUNTER — Encounter (HOSPITAL_COMMUNITY): Payer: Self-pay | Admitting: Emergency Medicine

## 2013-09-11 ENCOUNTER — Emergency Department (HOSPITAL_COMMUNITY)
Admission: EM | Admit: 2013-09-11 | Discharge: 2013-09-11 | Disposition: A | Discharge status: Home / Self Care | Payer: 59 | Attending: Emergency Medicine | Admitting: Emergency Medicine

## 2013-09-11 DIAGNOSIS — Z8614 Personal history of Methicillin resistant Staphylococcus aureus infection: Secondary | ICD-10-CM | POA: Insufficient documentation

## 2013-09-11 DIAGNOSIS — I4891 Unspecified atrial fibrillation: Secondary | ICD-10-CM | POA: Insufficient documentation

## 2013-09-11 DIAGNOSIS — R002 Palpitations: Secondary | ICD-10-CM

## 2013-09-11 DIAGNOSIS — Z9089 Acquired absence of other organs: Secondary | ICD-10-CM | POA: Insufficient documentation

## 2013-09-11 DIAGNOSIS — J3489 Other specified disorders of nose and nasal sinuses: Secondary | ICD-10-CM | POA: Insufficient documentation

## 2013-09-11 DIAGNOSIS — Z79899 Other long term (current) drug therapy: Secondary | ICD-10-CM | POA: Insufficient documentation

## 2013-09-11 DIAGNOSIS — Z7901 Long term (current) use of anticoagulants: Secondary | ICD-10-CM | POA: Insufficient documentation

## 2013-09-11 DIAGNOSIS — E785 Hyperlipidemia, unspecified: Secondary | ICD-10-CM | POA: Insufficient documentation

## 2013-09-11 DIAGNOSIS — Z85828 Personal history of other malignant neoplasm of skin: Secondary | ICD-10-CM | POA: Insufficient documentation

## 2013-09-11 NOTE — Telephone Encounter (Signed)
New Prob    Pt called in regarding a call he received about 20 minutes ago. Pt states he is unaware who called, but says he is currently at the ER as advised. He says he is doing a lot better, and currently waiting to be put into a room at the ED. Pt seemed concerned. Please call.

## 2013-09-11 NOTE — Telephone Encounter (Signed)
Called stating he had his 2nd Ablation in Nov but woke up this AM with SOB, very rapid and irreg heart beat, disoriented.  States he has Metoprolol 50 mg on hand but hasn't taken in about 6 wks. States he doesn't have a BP machine but has checked HR and is very fast.  Spoke w/Dr. Meda Coffee (DOD) and she advises for him to go to ER.  Advised pt but he states he wants to wait for awhile because he was feeling some better but would go to ER if didn't subside.  Stressed that with him being disoriented and SOB and rapid HR he should go to ER.  He verbalizes understanding.  Notified Trish that he would be coming to ER.

## 2013-09-11 NOTE — Discharge Instructions (Signed)
Palpitations   A palpitation is the feeling that your heartbeat is irregular or is faster than normal. It may feel like your heart is fluttering or skipping a beat. Palpitations are usually not a serious problem. However, in some cases, you may need further medical evaluation.  CAUSES   Palpitations can be caused by:   Smoking.   Caffeine or other stimulants, such as diet pills or energy drinks.   Alcohol.   Stress and anxiety.   Strenuous physical activity.   Fatigue.   Certain medicines.   Heart disease, especially if you have a history of arrhythmias. This includes atrial fibrillation, atrial flutter, or supraventricular tachycardia.   An improperly working pacemaker or defibrillator.  DIAGNOSIS   To find the cause of your palpitations, your caregiver will take your history and perform a physical exam. Tests may also be done, including:   Electrocardiography (ECG). This test records the heart's electrical activity.   Cardiac monitoring. This allows your caregiver to monitor your heart rate and rhythm in real time.   Holter monitor. This is a portable device that records your heartbeat and can help diagnose heart arrhythmias. It allows your caregiver to track your heart activity for several days, if needed.   Stress tests by exercise or by giving medicine that makes the heart beat faster.  TREATMENT   Treatment of palpitations depends on the cause of your symptoms and can vary greatly. Most cases of palpitations do not require any treatment other than time, relaxation, and monitoring your symptoms. Other causes, such as atrial fibrillation, atrial flutter, or supraventricular tachycardia, usually require further treatment.  HOME CARE INSTRUCTIONS    Avoid:   Caffeinated coffee, tea, soft drinks, diet pills, and energy drinks.   Chocolate.   Alcohol.   Stop smoking if you smoke.   Reduce your stress and anxiety. Things that can help you relax include:   A method that measures bodily functions so  you can learn to control them (biofeedback).   Yoga.   Meditation.   Physical activity such as swimming, jogging, or walking.   Get plenty of rest and sleep.  SEEK MEDICAL CARE IF:    You continue to have a fast or irregular heartbeat beyond 24 hours.   Your palpitations occur more often.  SEEK IMMEDIATE MEDICAL CARE IF:   You develop chest pain or shortness of breath.   You have a severe headache.   You feel dizzy, or you faint.  MAKE SURE YOU:   Understand these instructions.   Will watch your condition.   Will get help right away if you are not doing well or get worse.  Document Released: 08/21/2000 Document Revised: 12/19/2012 Document Reviewed: 10/23/2011  [employer] Patient Information 2014 [employer].

## 2013-09-11 NOTE — ED Notes (Addendum)
Pt states that he had an ablation in 07/2013 for A-Flutter.  Pt also had ablation in 2012.  Pt states that this morning around 0630 he felt his heart racing and began listening to it with a stethoscope.  Pt states his heart was irregular and 120 beats per minute.  Pt states this lasted for 1 - 2 hours.  Pt is NSR with rate 70's - 80's here.  Pt states that now he feels fine.  Pt has an appt this Thursday with Dr [name] (cardiology).

## 2013-09-11 NOTE — ED Notes (Signed)
Patient states started having problems with "racing heartbeat" this morning.  Patient states he had an ablation in November for a flutter.   Patient has had regular rhythm since.

## 2013-09-11 NOTE — ED Provider Notes (Signed)
CSN: [id number]     Arrival date & time 09/11/13  1024 History   First MD Initiated Contact with Patient 09/11/13 1224     Chief Complaint  Patient presents with  . Tachycardia   (Consider location/radiation/quality/duration/timing/severity/associated sxs/prior Treatment) HPI Comments: Patient has had both atrial flutter and atrial fibrillation in the past, has had 2 different heart ablations with Dr. [name].  Patient reports that he's had a head cold taking some extra strength Tylenol as needed over the last few days. He reports he had heart palpitations and felt a rapid, irregular heartbeat this morning. He reports he has a stethoscope and was measuring his heart rate, and he measured at approximately 120 per minute. He denied any chest pain, dizziness, lightheadedness or syncope. He called the [facility] cardiology, and do to his headache as well as his symptoms, was told he should likely come to the [facility] department to be checked out. He reports while in the car on the way to the emergency department, his heart rate had come down to about 100 and he was feeling improved at that time. He continues to feel improved, no longer feels his heart beat or any palpitations. No chest pain or shortness of breath, no diaphoresis, fevers. Patient denies any new medications, diet changes.  Patient is a 63 y.o. male presenting with palpitations. The history is provided by the patient and the spouse.  Palpitations Palpitations quality:  Irregular (and fast) Duration:  30 minutes Timing:  Constant Progression:  Resolved Chronicity:  Recurrent Associated symptoms: no back pain, no chest pain, no diaphoresis, no dizziness, no nausea, no shortness of breath and no vomiting     Past Medical History  Diagnosis Date  . Atrial fibrillation     s/p PVI by JA 4/12  . PVC's (premature ventricular contractions)   . Hyperlipidemia   . 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 Squamous carcinoma   . Basal cell carcinoma    Past Surgical History  Procedure Laterality Date  . Knee arthroscopy Left ~ 2009  . Tonsilectomy, adenoidectomy, bilateral myringotomy and tubes    . Basal cell carcinoma excision      "upper lip & back"  . Atrial fibrillation ablation  12/11/10    PVI and CTI ablation by JA  . Atrial flutter ablation  07/20/2013  . Tonsillectomy    . Inguinal hernia repair  [phone]  . Squamous cell carcinoma excision      "groin"   Family History  Problem Relation Age of Onset  . Hypertension Neg Hx   . Hyperlipidemia Neg Hx   . Heart failure Neg Hx   . Heart disease Neg Hx   . Heart attack Neg Hx    History  Substance Use Topics  . Smoking status: Never Smoker   . Smokeless tobacco: Never Used  . Alcohol Use: 0.6 oz/week    1 Cans of beer per week    Review of Systems  Constitutional: Negative for fever, chills and diaphoresis.  HENT: Positive for congestion.   Respiratory: Negative for chest tightness and shortness of breath.   Cardiovascular: Positive for palpitations. Negative for chest pain and leg swelling.  Gastrointestinal: Negative for nausea, vomiting and diarrhea.  Musculoskeletal: Negative for back pain.  Neurological: Negative for dizziness, syncope and light-headedness.    Allergies  Review of patient's allergies indicates no known allergies.  Home Medications   Current Outpatient Rx  Name  Route  Sig  Dispense  Refill  . acetaminophen (TYLENOL) 500 MG tablet   Oral   Take 1,000 mg by mouth every 6 (six) hours as needed for pain.         Marland Kitchen apixaban (ELIQUIS) 5 MG TABS tablet   Oral   Take 1 tablet (5 mg total) by mouth 2 (two) times daily.   180 tablet   3   . atorvastatin (LIPITOR) 10 MG tablet   Oral   Take 10 mg by mouth daily.            BP 127/97  Pulse 101  Temp(Src) 98 F (36.7 C) (Oral)  Resp 18  Ht 5\' 11"  (1.803 m)  Wt 188 lb (85.276 kg)  BMI 26.23 kg/m2  SpO2  99% Physical Exam  Nursing note and vitals reviewed. Constitutional: He appears well-developed and well-nourished. No distress.  HENT:  Head: Normocephalic and atraumatic.  Eyes: EOM are normal.  Cardiovascular: Normal rate, regular rhythm and intact distal pulses.   No murmur heard. Pulmonary/Chest: Effort normal. No respiratory distress.  Abdominal: Soft.  Neurological: He is alert.  Skin: Skin is warm. He is not diaphoretic.    ED Course  Procedures (including critical care time) Labs Review Labs Reviewed - No data to display Imaging Review No results found.  EKG Interpretation    at time time 10:30, normal sinus rhythm at a rate of 98, normal axis, normal intervals, poor R-wave progression noted between leads V2 to V4, no ST or T-wave abnormalities. Interpretation is borderline ECG.       MDM   1. Rapid palpitations    Patient is normotensive, ECG shows a sinus rhythm. He has no symptoms concerning for acute coronary syndrome. Patient and family are reassured. Patient can have routine followup with the [facility] cardiology later this week or next week. Patient reports she has been trying to cut back on caffeine already. No need for lab testing at this time my opinion. Patient to return for any additional or worrisome symptom.    Saddie Benders. Satoya Feeley, MD 09/11/13 1244

## 2013-09-14 ENCOUNTER — Ambulatory Visit (INDEPENDENT_AMBULATORY_CARE_PROVIDER_SITE_OTHER): Payer: 59 | Admitting: Internal Medicine

## 2013-09-14 ENCOUNTER — Encounter: Payer: Self-pay | Admitting: Internal Medicine

## 2013-09-14 VITALS — BP 126/88 | HR 73 | Ht 71.0 in | Wt 191.0 lb

## 2013-09-14 DIAGNOSIS — R002 Palpitations: Secondary | ICD-10-CM

## 2013-09-14 DIAGNOSIS — I4892 Unspecified atrial flutter: Secondary | ICD-10-CM

## 2013-09-14 DIAGNOSIS — I4891 Unspecified atrial fibrillation: Secondary | ICD-10-CM

## 2013-09-14 HISTORY — DX: Palpitations: R00.2

## 2013-09-14 MED ORDER — ASPIRIN EC 81 MG PO TBEC: 81.0000 mg | DELAYED_RELEASE_TABLET | Freq: Every day | ORAL | Status: DC

## 2013-09-14 NOTE — Patient Instructions (Signed)
Your physician recommends that you schedule a follow-up appointment in: 3 months with Dr Tamala Julian and 6 months with Dr [name]   Your physician has recommended you make the following change in your medication:  1) Stop Eliquis 2) Start Aspirin 81mg  daily

## 2013-09-14 NOTE — Progress Notes (Signed)
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  Since his atrial flutter ablation, the patient reports doing very well.  He reports occasional palpitations which are short lived.  He describes "heart pounding" at night.  He presented to [facility] recently but was in sinus rhythm upon presentation.  Today, he denies symptoms of chest pain, shortness of breath,  lower extremity edema, dizziness, presyncope, or syncope.  The patient is otherwise without complaint today.   Past Medical History  Diagnosis Date  . Atrial fibrillation     s/p PVI by JA 4/12  . PVC's (premature ventricular contractions)   . Hyperlipidemia   . 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 Squamous carcinoma   . Basal cell carcinoma   . Atrial flutter     s/p CTI ablation by Dr [name] 11/14   Past Surgical History  Procedure Laterality Date  . Knee arthroscopy Left ~ 2009  . Tonsilectomy, adenoidectomy, bilateral myringotomy and tubes    . Basal cell carcinoma excision      "upper lip & back"  . Atrial fibrillation ablation  12/11/10    PVI ablation by JA  . Atrial flutter ablation  07/20/2013    CTI by Dr [name]  . Tonsillectomy    . Inguinal hernia repair  [phone]  . Squamous cell carcinoma excision      "groin"    Current Outpatient Prescriptions  Medication Sig Dispense Refill  . acetaminophen (TYLENOL) 500 MG tablet Take 1,000 mg by mouth every 6 (six) hours as needed for pain.      Marland Kitchen atorvastatin (LIPITOR) 10 MG tablet Take 10 mg by mouth daily.        Marland Kitchen aspirin EC 81 MG tablet Take 1 tablet (81 mg total) by mouth daily.  90 tablet  3   No current facility-administered medications for this visit.    Physical Exam: Filed Vitals:   09/14/13 0848  BP: 126/88  Pulse: 73  Height: 5\' 11"  (1.803 m)  Weight: 191 lb (86.637 kg)    GEN- The  patient is well appearing, alert and oriented x 3 today.   Head- normocephalic, atraumatic Eyes-  Sclera clear, conjunctiva pink Ears- hearing intact Oropharynx- clear Lungs- Clear to ausculation bilaterally, normal work of breathing Heart- Regular rate and rhythm, no murmurs, rubs or gallops, PMI not laterally displaced GI- soft, NT, ND, + BS Extremities- no clubbing, cyanosis, or edema  ekg 09/11/13- sinus rhythm, anterior infarction  Assessment and Plan:  1. Atrial flutter- doing well s/p ablation  2. afib- no documented recurrence s/p ablation chads2vasc score is 0.  Stop eliquis today.  3. Palpitations- unclear etiology,  He declines event monitor today but will contact our office if he would like to have one placed. If he has sustained symptoms I would like for him to come to our office for an ekg  Return to see Dr Tamala Julian in 3 months I will see in a year

## 2013-09-24 ENCOUNTER — Other Ambulatory Visit: Payer: Self-pay | Admitting: *Deleted

## 2013-09-24 DIAGNOSIS — E785 Hyperlipidemia, unspecified: Secondary | ICD-10-CM

## 2013-10-18 ENCOUNTER — Other Ambulatory Visit (INDEPENDENT_AMBULATORY_CARE_PROVIDER_SITE_OTHER): Payer: 59

## 2013-10-18 DIAGNOSIS — E785 Hyperlipidemia, unspecified: Secondary | ICD-10-CM

## 2013-10-18 LAB — LIPID PANEL
CHOL/HDL RATIO: 3
CHOLESTEROL: 130 mg/dL (ref 0–200)
HDL: 48.1 mg/dL (ref >39.00)
LDL Cholesterol: 70 mg/dL (ref 0–99)
Triglycerides: 60 mg/dL (ref 0.0–149.0)
VLDL: 12 mg/dL (ref 0.0–40.0)

## 2013-10-18 LAB — HEPATIC FUNCTION PANEL
ALT: 21 U/L (ref 0–53)
AST: 20 U/L (ref 0–37)
Albumin: 3.9 g/dL (ref 3.5–5.2)
Alkaline Phosphatase: 48 U/L (ref 39–117)
BILIRUBIN TOTAL: 1.5 mg/dL — AB (ref 0.3–1.2)
Bilirubin, Direct: 0.2 mg/dL (ref 0.0–0.3)
Total Protein: 6.6 g/dL (ref 6.0–8.3)

## 2013-10-19 ENCOUNTER — Other Ambulatory Visit: Payer: Self-pay | Admitting: Interventional Cardiology

## 2013-10-26 ENCOUNTER — Telehealth: Payer: Self-pay

## 2013-10-26 DIAGNOSIS — E785 Hyperlipidemia, unspecified: Secondary | ICD-10-CM

## 2013-10-26 NOTE — Telephone Encounter (Signed)
Message copied by Lamar Laundry on Thu Oct 26, 2013  3:30 PM ------      Message from: Daneen Schick      Created: Thu Oct 19, 2013  5:53 PM       At target. Repeat 1 year ------

## 2013-10-26 NOTE — Telephone Encounter (Signed)
lmom.At target. Repeat 1 year.copy of labs mailed to pt

## 2014-03-08 ENCOUNTER — Other Ambulatory Visit: Payer: Self-pay | Admitting: Interventional Cardiology

## 2014-03-19 ENCOUNTER — Encounter: Payer: Self-pay | Admitting: Internal Medicine

## 2014-03-19 ENCOUNTER — Ambulatory Visit (INDEPENDENT_AMBULATORY_CARE_PROVIDER_SITE_OTHER): Payer: 59 | Admitting: Internal Medicine

## 2014-03-19 VITALS — BP 122/80 | HR 58 | Ht 71.0 in | Wt 195.0 lb

## 2014-03-19 DIAGNOSIS — I480 Paroxysmal atrial fibrillation: Secondary | ICD-10-CM

## 2014-03-19 DIAGNOSIS — I4891 Unspecified atrial fibrillation: Secondary | ICD-10-CM

## 2014-03-19 NOTE — Progress Notes (Signed)
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  Since his last visit, the patient reports doing very well.  He reports occasional palpitations which are short lived.  He is maintaining sinus rhythm off of AAD therapy.  He walks two miles per day without any ischemic symptoms.  He is working on his daughters wedding which will be within the next year.  He is also travelling frequently with his job and will be in [place] over the next few days.  Today, he denies symptoms of chest pain, shortness of breath,  lower extremity edema, dizziness, presyncope, or syncope.  The patient is otherwise without complaint today.   Past Medical History  Diagnosis Date  . Atrial fibrillation     s/p PVI by JA 4/12  . PVC's (premature ventricular contractions)   . Hyperlipidemia   . 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 Squamous carcinoma   . Basal cell carcinoma   . Atrial flutter     s/p CTI ablation by Dr [name] 11/14   Past Surgical History  Procedure Laterality Date  . Knee arthroscopy Left ~ 2009  . Tonsilectomy, adenoidectomy, bilateral myringotomy and tubes    . Basal cell carcinoma excision      "upper lip & back"  . Atrial fibrillation ablation  12/11/10    PVI ablation by JA  . Atrial flutter ablation  07/20/2013    CTI by Dr [name]  . Tonsillectomy    . Inguinal hernia repair  [phone]  . Squamous cell carcinoma excision      "groin"    Current Outpatient Prescriptions  Medication Sig Dispense Refill  . acetaminophen (TYLENOL) 500 MG tablet Take 1,000 mg by mouth every 6 (six) hours as needed for pain.      Marland Kitchen aspirin EC 81 MG tablet Take 1 tablet (81 mg total) by mouth daily.  90 tablet  3  . atorvastatin (LIPITOR) 10 MG tablet Take 1 tablet by mouth once a day  90 tablet  0   No current facility-administered medications  for this visit.    Physical Exam: Filed Vitals:   03/19/14 0933  BP: 122/80  Pulse: 58  Height: 5\' 11"  (1.803 m)  Weight: 195 lb (88.451 kg)    GEN- The patient is well appearing, alert and oriented x 3 today.   Head- normocephalic, atraumatic Eyes-  Sclera clear, conjunctiva pink Ears- hearing intact Oropharynx- clear Lungs- Clear to ausculation bilaterally, normal work of breathing Heart- Regular rate and rhythm, no murmurs, rubs or gallops, PMI not laterally displaced GI- soft, NT, ND, + BS Extremities- no clubbing, cyanosis, or edema  ekg today- sinus rhythm, bienK WJF  Assessment and Plan:  1. Atrial flutter/ afib - doing well s/p ablation Maintaining sinus rhythm off of AAD therapy chads2vasc score is 0.  No indication for anticoagulation   Return to see Dr Tamala Julian in 6 months I will see in a year

## 2014-03-19 NOTE — Patient Instructions (Signed)
Your physician wants you to follow-up in: 6 months with Dr Tamala Julian and 12 months with Dr Vallery Ridge will receive a reminder letter in the mail two months in advance. If you don't receive a letter, please call our office to schedule the follow-up appointment.

## 2014-07-13 ENCOUNTER — Other Ambulatory Visit: Payer: Self-pay | Admitting: *Deleted

## 2014-07-13 MED ORDER — ATORVASTATIN CALCIUM 10 MG PO TABS: ORAL_TABLET | ORAL | Status: DC

## 2014-08-16 ENCOUNTER — Encounter (HOSPITAL_COMMUNITY): Payer: Self-pay | Admitting: Internal Medicine

## 2014-10-08 ENCOUNTER — Other Ambulatory Visit: Payer: Self-pay | Admitting: Interventional Cardiology

## 2014-10-22 ENCOUNTER — Ambulatory Visit (INDEPENDENT_AMBULATORY_CARE_PROVIDER_SITE_OTHER): Payer: 59 | Admitting: Interventional Cardiology

## 2014-10-22 ENCOUNTER — Encounter: Payer: Self-pay | Admitting: Interventional Cardiology

## 2014-10-22 VITALS — BP 132/84 | HR 68 | Ht 71.0 in | Wt 201.8 lb

## 2014-10-22 DIAGNOSIS — R002 Palpitations: Secondary | ICD-10-CM

## 2014-10-22 DIAGNOSIS — I495 Sick sinus syndrome: Secondary | ICD-10-CM

## 2014-10-22 DIAGNOSIS — I480 Paroxysmal atrial fibrillation: Secondary | ICD-10-CM

## 2014-10-22 DIAGNOSIS — I483 Typical atrial flutter: Secondary | ICD-10-CM

## 2014-10-22 NOTE — Patient Instructions (Signed)
Your physician recommends that you continue on your current medications as directed. Please refer to the Current Medication list given to you today.   Your physician recommends that you schedule a follow-up appointment as needed  

## 2014-10-22 NOTE — Progress Notes (Signed)
Cardiology Office Note   Date:  10/22/2014   ID:  Douglas Rodriguez, DOB Mar 16, 1951, MRN [id number]  PCP:  Imagene Riches, MD  Cardiologist:   Sinclair Grooms, MD   No chief complaint on file.     History of Present Illness: Douglas Rodriguez is a 64 y.o. male who presents for atrial fibrillation (status post ablation 2012) and atrial flutter (status post ablation 2014). He is on no antiarrhythmic or anticoagulant therapy. He is transitioned back to an aspirin a day. His Chads Vasc is 0. He has occasional palpitations but nothing prolonged that would suggest atrial fib or flutter. He denies angina, dyspnea, syncope, transient neurological symptoms, peripheral edema, and exertional intolerance.   Past Medical History  Diagnosis Date  . Atrial fibrillation     s/p PVI by JA 4/12  . PVC's (premature ventricular contractions)   . Hyperlipidemia   . 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 Squamous carcinoma   . Basal cell carcinoma   . Atrial flutter     s/p CTI ablation by Dr [name] 11/14    Past Surgical History  Procedure Laterality Date  . Knee arthroscopy Left ~ 2009  . Tonsilectomy, adenoidectomy, bilateral myringotomy and tubes    . Basal cell carcinoma excision      "upper lip & back"  . Atrial fibrillation ablation  12/11/10    PVI ablation by JA  . Atrial flutter ablation  07/20/2013    CTI by Dr [name]  . Tonsillectomy    . Inguinal hernia repair  [phone]  . Squamous cell carcinoma excision      "groin"  . Atrial flutter ablation N/A 07/20/2013    Procedure: ATRIAL FLUTTER ABLATION;  Surgeon: [name], MD;  Location: [facility];  Service: Cardiovascular;  Laterality: N/A;     Current Outpatient Prescriptions  Medication Sig Dispense Refill  . acetaminophen (TYLENOL) 500 MG tablet Take 1,000 mg by mouth every 6 (six) hours as needed for pain.    Marland Kitchen aspirin EC 81 MG tablet Take 1 tablet (81 mg  total) by mouth daily. 90 tablet 3  . atorvastatin (LIPITOR) 10 MG tablet Take 1 tablet by mouth once a day 90 tablet 1   No current facility-administered medications for this visit.    Allergies:   Review of patient's allergies indicates no known allergies.    Social History:  The patient  reports that he has never smoked. He has never used smokeless tobacco. He reports that he drinks about 0.6 oz of alcohol per week. He reports that he does not use illicit drugs.   Family History:  The patient's family history is negative for Hypertension, Hyperlipidemia, Heart failure, Heart disease, and Heart attack.    ROS:  Please see the history of present illness.   Otherwise, review of systems are positive for none.   All other systems are reviewed and negative.    PHYSICAL EXAM: VS:  BP 132/84 mmHg  Pulse 68  Ht 5\' 11"  (1.803 m)  Wt 201 lb 12.8 oz (91.536 kg)  BMI 28.16 kg/m2  SpO2 98% , BMI Body mass index is 28.16 kg/(m^2). GEN: Well nourished, well developed, in no acute distress HEENT: normal Neck: no JVD, carotid bruits, or masses Cardiac: RRR; no murmurs, rubs, or gallops,no edema  Respiratory:  clear to auscultation bilaterally, normal work of breathing GI: soft, nontender, nondistended, + BS MS: no deformity or atrophy  Skin: warm and dry, no rash Neuro:  Strength and sensation are intact Psych: euthymic mood, full affect   EKG:  EKG is not ordered today.   Recent Labs: No results found for requested labs within last 365 days.    Lipid Panel    Component Value Date/Time   CHOL 130 10/18/2013 0816   TRIG 60.0 10/18/2013 0816   HDL 48.10 10/18/2013 0816   CHOLHDL 3 10/18/2013 0816   VLDL 12.0 10/18/2013 0816   LDLCALC 70 10/18/2013 0816      Wt Readings from Last 3 Encounters:  10/22/14 201 lb 12.8 oz (91.536 kg)  03/19/14 195 lb (88.451 kg)  09/14/13 191 lb (86.637 kg)      Other studies Reviewed: Additional studies/ records that were reviewed today  include: None.   ASSESSMENT AND PLAN:  1.  Past history of atrial fibrillation, status post ablation 2012.. No documented recurrences.  2. Past history of atrial flutter, status post ablation 2014. No documented recurrences.   Current medicines are reviewed at length with the patient today.  The patient does not have concerns regarding medicines.  The following changes have been made:  no change  Labs/ tests ordered today include: None  No orders of the defined types were placed in this encounter.     Disposition:   FU with Linard Millers as needed and Dr. Thompson Grayer in 6 months.   Signed, Sinclair Grooms, MD  10/22/2014 3:19 PM    [facility] [address], [place], [place]  [address] Phone: [phone]; Fax: [phone]

## 2015-04-06 ENCOUNTER — Other Ambulatory Visit: Payer: Self-pay | Admitting: Interventional Cardiology

## 2016-04-13 ENCOUNTER — Other Ambulatory Visit: Payer: Self-pay | Admitting: Interventional Cardiology

## 2016-06-01 ENCOUNTER — Other Ambulatory Visit: Payer: Self-pay | Admitting: Interventional Cardiology

## 2018-11-22 ENCOUNTER — Emergency Department (HOSPITAL_COMMUNITY)
Admission: EM | Admit: 2018-11-22 | Discharge: 2018-11-22 | Disposition: A | Discharge status: Home / Self Care | Payer: Medicare Other | Attending: Emergency Medicine | Admitting: Emergency Medicine

## 2018-11-22 ENCOUNTER — Other Ambulatory Visit: Payer: Self-pay

## 2018-11-22 DIAGNOSIS — Z79899 Other long term (current) drug therapy: Secondary | ICD-10-CM | POA: Insufficient documentation

## 2018-11-22 DIAGNOSIS — E860 Dehydration: Secondary | ICD-10-CM | POA: Diagnosis not present

## 2018-11-22 DIAGNOSIS — Z7982 Long term (current) use of aspirin: Secondary | ICD-10-CM | POA: Insufficient documentation

## 2018-11-22 DIAGNOSIS — R42 Dizziness and giddiness: Secondary | ICD-10-CM | POA: Diagnosis present

## 2018-11-22 NOTE — Discharge Instructions (Addendum)
The testing indicates that you were likely dehydrated.  Blood sugar medication may be contributing to that.  Try to drink an extra liter of water daily, and make sure you are eating 3 good meals a day.  See your doctor for checkup in a week or so and see if she still wants you on the same medications for your blood pressure.  Return here, if needed, for problems.

## 2018-11-22 NOTE — ED Provider Notes (Signed)
[facility] EMERGENCY DEPARTMENT Provider Note   CSN: [id number] Arrival date & time: 11/22/18  1142    History   Chief Complaint Chief Complaint  Patient presents with  . Dizziness    HPI Douglas Rodriguez is a 68 y.o. male.     HPI   He presents by EMS for evaluation of dizziness.  They felt he was orthostatic because his heart rate increased from 60-90 with standing.  During transport he was treated with IV Zofran and saline, 750 mg.  CBG en route 86.  Patient also complains of nausea this morning.  He has not had any vomiting or diarrhea.  He has not eaten yet.  Yesterday he went out to eat with his wife in the evening, but had no problems at that time.  Denies recent fever, chills, cough, shortness of breath, focal weakness or paresthesia.  Today he noticed dizziness only when standing and improves when he lies down.  He has history of atrial fibrillation, but only occasionally has it, for couple hours.  He is status post ablation.  He does not take anticoagulants.  There are no other known modifying factors.  Past Medical History:  Diagnosis Date  . Atrial fibrillation    s/p PVI by JA 4/12  . Atrial flutter    s/p CTI ablation by Dr [name] 11/14  . Basal cell carcinoma   . Hyperlipidemia   . 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 PVC's (premature ventricular contractions)   . Squamous carcinoma     Patient Active Problem List   Diagnosis Date Noted  . Palpitation 09/14/2013  . Long term (current) use of anticoagulants 06/07/2013    Class: Acute  . Atrial flutter ([place]) 06/07/2013  . Atrial fibrillation ([place]) 10/23/2010  . 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 ATRIAL FIBRILLATION ABLATION  12/11/10   PVI ablation by JA  . ATRIAL FLUTTER ABLATION  07/20/2013   CTI by Dr [name]  . ATRIAL FLUTTER ABLATION N/A 07/20/2013   Procedure: ATRIAL FLUTTER ABLATION;  Surgeon: [name], MD;  Location: [facility] CATH LAB;  Service: Cardiovascular;  Laterality: N/A;  . BASAL CELL CARCINOMA EXCISION     "upper lip & back"  . INGUINAL HERNIA REPAIR  [phone]  . KNEE ARTHROSCOPY Left ~ 2009  . SQUAMOUS CELL CARCINOMA EXCISION     "groin"  . TONSILECTOMY, ADENOIDECTOMY, BILATERAL MYRINGOTOMY AND TUBES    . TONSILLECTOMY          Home Medications    Prior to Admission medications   Medication Sig Start Date End Date Taking? Authorizing Provider  acetaminophen (TYLENOL) 500 MG tablet Take 1,000 mg by mouth every 6 (six) hours as needed for pain.   Yes [provider]  aspirin EC 81 MG tablet Take 1 tablet (81 mg total) by mouth daily. 09/14/13  Yes Allred, [name], MD  atorvastatin (LIPITOR) 10 MG tablet TAKE 1 TABLET BY MOUTH ONCE A DAY FOR CHOLESTEROL 04/13/16  Yes Belva Crome, MD  fluticasone [facility]) 50 MCG/ACT nasal spray Place 2 sprays into both nostrils daily.   Yes [provider]  olmesartan-hydrochlorothiazide (BENICAR HCT) 20-12.5 MG tablet Take 1 tablet by mouth daily.   Yes [provider]    Family History Family History  Problem Relation Age of Onset  . Hypertension Neg Hx   . Hyperlipidemia Neg Hx   .  Heart failure Neg Hx   . Heart disease Neg Hx   . Heart attack Neg Hx     Social History Social History   Tobacco Use  . Smoking status: Never Smoker  . Smokeless tobacco: Never Used  Substance Use Topics  . Alcohol use: Yes    Alcohol/week: 1.0 standard drinks    Types: 1 Cans of beer per week  . Drug use: No     Allergies   Patient has no known allergies.   Review of Systems Review of Systems  All other systems reviewed and are negative.    Physical Exam Updated Vital Signs BP 133/87   Pulse 61   Resp 19   Ht 5\' 11"  (1.803 m)   Wt 89.8 kg   SpO2 100%   BMI 27.62 kg/m   Physical Exam Vitals signs and nursing note reviewed.  Constitutional:       General: He is not in acute distress.    Appearance: Normal appearance. He is well-developed and normal weight. He is not ill-appearing, toxic-appearing or diaphoretic.  HENT:     Head: Normocephalic and atraumatic.     Right Ear: External ear normal.     Left Ear: External ear normal.     Nose: Nose normal. No congestion or rhinorrhea.     Mouth/Throat:     Pharynx: No oropharyngeal exudate or posterior oropharyngeal erythema.  Eyes:     Conjunctiva/sclera: Conjunctivae normal.     Pupils: Pupils are equal, round, and reactive to light.  Neck:     Musculoskeletal: Normal range of motion and neck supple.     Trachea: Phonation normal.  Cardiovascular:     Rate and Rhythm: Normal rate and regular rhythm.     Heart sounds: Normal heart sounds.  Pulmonary:     Effort: Pulmonary effort is normal.     Breath sounds: Normal breath sounds.  Abdominal:     Palpations: Abdomen is soft.     Tenderness: There is no abdominal tenderness.  Musculoskeletal: Normal range of motion.  Skin:    General: Skin is warm and dry.  Neurological:     Mental Status: He is alert and oriented to person, place, and time.     Cranial Nerves: No cranial nerve deficit.     Sensory: No sensory deficit.     Motor: No abnormal muscle tone.     Coordination: Coordination normal.     Comments: No dysarthria or aphasia  Psychiatric:        Mood and Affect: Mood normal.        Behavior: Behavior normal.        Thought Content: Thought content normal.        Judgment: Judgment normal.      ED Treatments / Results  Labs (all labs ordered are listed, but only abnormal results are displayed) Labs Reviewed - No data to display  EKG EKG Interpretation  Date/Time:  Tuesday November 22 2018 12:00:46 EDT Ventricular Rate:  55 PR Interval:    QRS Duration: 96 QT Interval:  448 QTC Calculation: 429 R Axis:   60 Text Interpretation:  Sinus rhythm Baseline wander in lead(s) V1 V3 since last tracing no  significant change Confirmed by Daleen Bo [phone]) on 11/22/2018 12:15:18 PM   Radiology No results found.  Procedures Procedures (including critical care time)  Medications Ordered in ED Medications - No data to display   Initial Impression / Assessment and Plan / ED Course  I  have reviewed the triage vital signs and the nursing notes.  Pertinent labs & imaging results that were available during my care of the patient were reviewed by me and considered in my medical decision making (see chart for details).  Clinical Course as of Nov 21 1517  Tue Nov 22, 2018  1206 Orthostatic vital signs are negative   [EW]    Clinical Course User Index [EW] Daleen Bo, MD        Patient Vitals for the past 24 hrs:  BP Pulse Resp SpO2 Height Weight  11/22/18 1400 133/87 61 19 100 % - -  11/22/18 1215 - - - - 5\' 11"  (1.803 m) 89.8 kg    3:19 PM Reevaluation with update and discussion. After initial assessment and treatment, an updated evaluation reveals he has been able to eat, drink and ambulate all without symptoms.  Findings discussed with the patient, and wife, and all questions were answered. Daleen Bo   Medical Decision Making: Symptoms consistent with orthostatic hypotension, causing dizziness.  Patient improved after treatment IV fluids.  He is currently on diuretic, which may be complicating his volume status.  Blood pressure is reassuring, at this time.  Since he is able to tolerate liquids and oral fluid, he is stable for discharge with current treatment plan by increasing his fluid intake.  He will follow-up with his PCP for discussion of ongoing medications for hypertension.  CRITICAL CARE-no Performed by: Daleen Bo  Nursing Notes Reviewed/ Care Coordinated Applicable Imaging Reviewed Interpretation of Laboratory Data incorporated into ED treatment  The patient appears reasonably screened and/or stabilized for discharge and I doubt any other medical condition  or other [facility] requiring further screening, evaluation, or treatment in the ED at this time prior to discharge.  Plan: Home Medications-continue current medications; Home Treatments-increase daily fluid intake by 1 L of water; return here if the recommended treatment, does not improve the symptoms; Recommended follow up-ECP follow-up 1 week and as needed   Final Clinical Impressions(s) / ED Diagnoses   Final diagnoses:  Dehydration    ED Discharge Orders    None       Daleen Bo, MD 11/22/18 [phone]

## 2018-11-22 NOTE — ED Notes (Signed)
Pt ambulated in hallway with no complaints of dizziness. Pt stated he felt much better

## 2018-11-22 NOTE — ED Notes (Signed)
Pt given drink and crackers  

## 2018-11-22 NOTE — ED Notes (Signed)
Patient verbalizes understanding of discharge instructions. Opportunity for questioning and answers were provided. Armband removed by staff, pt discharged from ED ambulatory to home.  

## 2018-11-22 NOTE — ED Triage Notes (Signed)
Patient is from home.  Called EMS for C/O dizziness when he stands up.  Per EMS heart rate at rest was 60 which increased to 90 when he stood up.  4 mg zofran and 750 ml NS given by EMS.  CBG - 86.

## 2019-06-09 ENCOUNTER — Telehealth: Payer: Self-pay

## 2019-06-09 NOTE — Telephone Encounter (Signed)
NOTES ON FILE FROM [facility] [place] [phone], REFERRAL SENT TO [place]

## 2019-06-15 NOTE — Progress Notes (Signed)
Cardiology Office Note   Date:  06/16/2019   ID:  DAYTRON GULAN, DOB 1951-01-28, MRN [id number]  PCP:  System, Pcp Not In  Cardiologist:   No primary care provider on file.   Chief Complaint  Patient presents with  . Fatigue      History of Present Illness: Douglas Rodriguez is a 68 y.o. male who was referred by System, Pcp Not In for evaluation of weakness   He had atrial fib status post ablation in 2012.  He had flutter with ablation in 2014.  He has not been seen since 2016.    He has been having progressive weakness.  This is been going on for months.  He is just tired all the time.  He wakes up.  He does report that he does not sleep very well.  He has nocturia.  He also has a hard time falling back to sleep.  He is not describing any new symptoms such as PND or orthopnea.  He is not having any new palpitations, presyncope or syncope.  He has had decreased appetite and some weight loss.  He has had labs that included normal CBC and thyroid.  He has brief paroxysms of atrial fibrillation but none of the sustained symptoms that he had previously.  He has not been having any fevers or chills.  He is not been having any new shortness of breath.   Past Medical History:  Diagnosis Date  . Atrial fibrillation ([place])    s/p PVI by [facility] 4/12  . Atrial flutter [facility])    s/p CTI ablation by Dr [name] 11/14  . Basal cell carcinoma   . 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 Hyperlipidemia   . Long term (current) use of anticoagulants 06/07/2013   Eliquis   . 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 Palpitation 09/14/2013  . PVC's (premature ventricular contractions)   . Squamous carcinoma     Past Surgical History:  Procedure Laterality Date  . ATRIAL FIBRILLATION ABLATION  12/11/10   PVI ablation by JA  . ATRIAL FLUTTER ABLATION  07/20/2013   CTI by Dr [name]  .  ATRIAL FLUTTER ABLATION N/A 07/20/2013   Procedure: ATRIAL FLUTTER ABLATION;  Surgeon: [name], MD;  Location: [facility] CATH LAB;  Service: Cardiovascular;  Laterality: N/A;  . BASAL CELL CARCINOMA EXCISION     "upper lip & back"  . INGUINAL HERNIA REPAIR  [phone]  . KNEE ARTHROSCOPY Left ~ 2009  . SQUAMOUS CELL CARCINOMA EXCISION     "groin"  . TONSILECTOMY, ADENOIDECTOMY, BILATERAL MYRINGOTOMY AND TUBES    . TONSILLECTOMY       Current Outpatient Medications  Medication Sig Dispense Refill  . acetaminophen (TYLENOL) 500 MG tablet Take 1,000 mg by mouth every 6 (six) hours as needed for pain.    Marland Kitchen aspirin EC 81 MG tablet Take 1 tablet (81 mg total) by mouth daily. 90 tablet 3  . atorvastatin (LIPITOR) 10 MG tablet TAKE 1 TABLET BY MOUTH ONCE A DAY FOR CHOLESTEROL 30 tablet 0  . fluticasone (FLONASE) 50 MCG/ACT nasal spray Place 2 sprays into both nostrils daily.    Marland Kitchen olmesartan-hydrochlorothiazide (BENICAR HCT) 20-12.5 MG tablet Take 1 tablet by mouth daily.     No current facility-administered medications for this visit.     Allergies:   Patient has no known allergies.  Social History:  The patient  reports that he has never smoked. He has never used smokeless tobacco. He reports current alcohol use of about 1.0 standard drinks of alcohol per week. He reports that he does not use drugs.   Family History: No pertinent cardiovascular history.   ROS:  Please see the history of present illness.   Otherwise, review of systems are positive for none.   All other systems are reviewed and negative.    PHYSICAL EXAM: VS:  BP (!) 128/92   Pulse (!) 57   Ht 5\' 11"  (1.803 m)   Wt 186 lb (84.4 kg)   SpO2 98%   BMI 25.94 kg/m  , BMI Body mass index is 25.94 kg/m. GENERAL:  Well appearing HEENT:  Pupils equal round and reactive, fundi not visualized, oral mucosa unremarkable NECK:  No jugular venous distention, waveform within normal limits, carotid upstroke brisk and symmetric,  no bruits, no thyromegaly LYMPHATICS:  No cervical, inguinal adenopathy LUNGS:  Clear to auscultation bilaterally BACK:  No CVA tenderness CHEST:  Unremarkable HEART:  PMI not displaced or sustained,S1 and S2 within normal limits, no S3, no S4, no clicks, no rubs, no murmurs ABD:  Flat, positive bowel sounds normal in frequency in pitch, no bruits, no rebound, no guarding, no midline pulsatile mass, no hepatomegaly, no splenomegaly EXT:  2 plus pulses throughout, no edema, no cyanosis no clubbing SKIN:  No rashes no nodules NEURO:  Cranial nerves II through XII grossly intact, motor grossly intact throughout PSYCH:  Cognitively intact, oriented to person place and time    EKG:  EKG is ordered today. The ekg ordered today demonstrates sinus rhythm, rate 57, sinus arrhythmia, axis within normal limits, intervals within normal limits, no acute ST-T wave changes.   Recent Labs: No results found for requested labs within last 8760 hours.    Lipid Panel    Component Value Date/Time   CHOL 130 10/18/2013 0816   TRIG 60.0 10/18/2013 0816   HDL 48.10 10/18/2013 0816   CHOLHDL 3 10/18/2013 0816   VLDL 12.0 10/18/2013 0816   LDLCALC 70 10/18/2013 0816      Wt Readings from Last 3 Encounters:  06/16/19 186 lb (84.4 kg)  11/22/18 198 lb (89.8 kg)  10/22/14 201 lb 12.8 oz (91.5 kg)      Other studies Reviewed: Additional studies/ records that were reviewed today include: Office records, previous cardiology records. . Review of the above records demonstrates:  Please see elsewhere in the note.     ASSESSMENT AND PLAN:  ATRIAL FIB: The patient has occasional what he thinks are paroxysms of his fibrillation.  They might last for an hour or 2. Mr. NEVYN GARG has a CHA2DS2 - VASc score of 2.  We talked about the indication for anticoagulation.  I have suggested that he consider Xarelto or Eliquis.  He would have an indication according to guidelines.  We talked about risk  benefits.  He is going to think about it.  I talked to him about possibly getting an Alive Cor to make sure he is actually having paroxysms of A. fib although given the fact that we would not be able to capture all these symptoms think it is prudent to believe that he does have these.  HTN: I think there is a small possibility he is having symptoms related to his olmesartan.  I told him that he could discontinue this for a couple of weeks and see if he has any  improvement but he will need to keep a blood pressure diary as he will likely need therapy.  He will let me know his response to coming off hazmat.  FATIGUE: He is had normal CBC and TSH.  I do not see any evidence of heart failure.  I would not suspect that this is primarily cardiac.  He sleeps very poorly.  This seems to be related to nocturia.  I asked that he first to get this issue addressed.  See his primary provider about this and may be urology.  If this gets corrected and he still not sleeping well and fatigue he might need further evaluation with a sleep physician.  He go back and discuss this further with his provider.     Current medicines are reviewed at length with the patient today.  The patient does not have concerns regarding medicines.  The following changes have been made:  no change  Labs/ tests ordered today include: None  Orders Placed This Encounter  Procedures  . EKG 12-Lead     Disposition:   FU with me in one year or sooner if he would like for me to follow his HTN or atrial fib.     Signed, [name], MD  06/16/2019 1:53 PM    [facility]

## 2019-06-16 ENCOUNTER — Encounter: Payer: Self-pay | Admitting: Cardiology

## 2019-06-16 ENCOUNTER — Ambulatory Visit: Payer: Medicare Other | Admitting: Cardiology

## 2019-06-16 ENCOUNTER — Other Ambulatory Visit: Payer: Self-pay

## 2019-06-16 VITALS — BP 128/92 | HR 57 | Ht 71.0 in | Wt 186.0 lb

## 2019-06-16 DIAGNOSIS — I493 Ventricular premature depolarization: Secondary | ICD-10-CM | POA: Diagnosis not present

## 2019-06-16 DIAGNOSIS — E785 Hyperlipidemia, unspecified: Secondary | ICD-10-CM | POA: Diagnosis not present

## 2019-06-16 DIAGNOSIS — I4819 Other persistent atrial fibrillation: Secondary | ICD-10-CM

## 2019-06-16 NOTE — Patient Instructions (Signed)
Medication Instructions:  Your physician recommends that you continue on your current medications as directed. Please refer to the Current Medication list given to you today.  If you need a refill on your cardiac medications before your next appointment, please call your pharmacy.   Lab work: NONE  Testing/Procedures: NONE  Follow-Up: At [facility], you and your health needs are our priority.  As part of our continuing mission to provide you with exceptional heart care, we have created designated Provider Care Teams.  These Care Teams include your primary Cardiologist (physician) and Advanced Practice Providers (APPs -  Physician Assistants and Nurse Practitioners) who all work together to provide you with the care you need, when you need it. You will need a follow up appointment in 12 months.  Please call our office 2 months in advance to schedule this appointment.  You may see Dr. Hochrein or one of the following Advanced Practice Providers on your designated Care Team:   Rhonda Barrett, PA-C Kathryn Lawrence, DNP, ANP      

## 2019-08-07 ENCOUNTER — Ambulatory Visit: Payer: Medicare Other | Admitting: Interventional Cardiology

## 2019-09-26 ENCOUNTER — Ambulatory Visit: Payer: Medicare Other

## 2019-09-27 ENCOUNTER — Ambulatory Visit: Payer: Medicare Other | Attending: Internal Medicine

## 2019-09-27 DIAGNOSIS — Z23 Encounter for immunization: Secondary | ICD-10-CM

## 2019-09-27 NOTE — Progress Notes (Signed)
   Covid-19 Vaccination Clinic  Name:  Douglas Rodriguez    MRN: [id number] DOB: [birth year]  09/27/2019  [name] was observed post Covid-19 immunization for 15 minutes without incidence. He was provided with Vaccine Information Sheet and instruction to access the V-Safe system.   [name] was instructed to call 911 with any severe reactions post vaccine: Marland Kitchen Difficulty breathing  . Swelling of your face and throat  . A fast heartbeat  . A bad rash all over your body  . Dizziness and weakness    Immunizations Administered    Name Date Dose VIS Date Route   Pfizer COVID-19 Vaccine 09/27/2019 10:12 AM 0.3 mL 08/18/2019 Intramuscular   Manufacturer: [employer], [employer]   Lot: [id number]   [place]: [id number]

## 2019-10-18 ENCOUNTER — Ambulatory Visit: Payer: Medicare Other | Attending: Internal Medicine

## 2019-10-18 DIAGNOSIS — Z23 Encounter for immunization: Secondary | ICD-10-CM | POA: Insufficient documentation

## 2019-10-18 NOTE — Progress Notes (Signed)
   Covid-19 Vaccination Clinic  Name:  Douglas Rodriguez    MRN: [id number] DOB: 1951/04/28  10/18/2019  [name] was observed post Covid-19 immunization for 15 minutes without incidence. He was provided with Vaccine Information Sheet and instruction to access the V-Safe system.   [name] was instructed to call 911 with any severe reactions post vaccine: Marland Kitchen Difficulty breathing  . Swelling of your face and throat  . A fast heartbeat  . A bad rash all over your body  . Dizziness and weakness    Immunizations Administered    Name Date Dose VIS Date Route   Pfizer COVID-19 Vaccine 10/18/2019  1:27 PM 0.3 mL 08/18/2019 Intramuscular   Manufacturer: [place]   Lot: [id number]   [place]: [id number]

## 2020-07-01 DIAGNOSIS — R5383 Other fatigue: Secondary | ICD-10-CM | POA: Insufficient documentation

## 2020-07-01 DIAGNOSIS — I10 Essential (primary) hypertension: Secondary | ICD-10-CM | POA: Insufficient documentation

## 2020-07-01 NOTE — Progress Notes (Signed)
Cardiology Office Note   Date:  07/02/2020   ID:  KATE SWEETMAN, DOB 11/23/1950, MRN [id number]  PCP:  [name]., MD  Cardiologist:   [name], MD   Chief Complaint  Patient presents with  . Atrial Fibrillation      History of Present Illness: Douglas Rodriguez is a 69 y.o. male who presents for follow up of atrial fib.  He had atrial fib ablation in 2012.  He had flutter with ablation in 2014.   The patient does feel his palpitations periodically and he says is reminiscent of his previous atrial fibrillation.  However, is not as severe as A. fib and flutter were.  Last for about an hour at a time.  It happens when he drinks alcohol.  Happens when he has caffeine.  It happens if he is overly tired.  He might take some extra strength Tylenol and it seems to go away.  He has not had any presyncope or syncope.  He has not had any new shortness of breath, PND orthopnea.  He denies any chest pressure, neck or arm discomfort.  He has had no weight gain or edema.  Past Medical History:  Diagnosis Date  . Atrial fibrillation ([place])    s/p PVI by [facility] 4/12  . Atrial flutter [facility])    s/p CTI ablation by Dr [name] 11/14  . Basal cell carcinoma   . 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 Hyperlipidemia   . Long term (current) use of anticoagulants 06/07/2013   Eliquis   . 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 Palpitation 09/14/2013  . PVC's (premature ventricular contractions)   . Squamous carcinoma     Past Surgical History:  Procedure Laterality Date  . ATRIAL FIBRILLATION ABLATION  12/11/10   PVI ablation by JA  . ATRIAL FLUTTER ABLATION  07/20/2013   CTI by Dr [name]  . ATRIAL FLUTTER ABLATION N/A 07/20/2013   Procedure: ATRIAL FLUTTER ABLATION;  Surgeon: [name], MD;  Location: [place] CATH LAB;  Service: Cardiovascular;  Laterality: N/A;  .  BASAL CELL CARCINOMA EXCISION     "upper lip & back"  . INGUINAL HERNIA REPAIR  [phone]  . KNEE ARTHROSCOPY Left ~ 2009  . SQUAMOUS CELL CARCINOMA EXCISION     "groin"  . TONSILECTOMY, ADENOIDECTOMY, BILATERAL MYRINGOTOMY AND TUBES    . TONSILLECTOMY       Current Outpatient Medications  Medication Sig Dispense Refill  . acetaminophen (TYLENOL) 500 MG tablet Take 1,000 mg by mouth every 6 (six) hours as needed for pain.    Marland Kitchen aspirin EC 81 MG tablet Take 1 tablet (81 mg total) by mouth daily. 90 tablet 3  . atorvastatin (LIPITOR) 10 MG tablet TAKE 1 TABLET BY MOUTH ONCE A DAY FOR CHOLESTEROL 30 tablet 0  . fluticasone (FLONASE) 50 MCG/ACT nasal spray Place 2 sprays into both nostrils daily.    Marland Kitchen olmesartan-hydrochlorothiazide (BENICAR HCT) 20-12.5 MG tablet Take 1 tablet by mouth daily.     No current facility-administered medications for this visit.    Allergies:   Patient has no known allergies.    ROS:  Please see the history of present illness.   Otherwise, review of systems are positive for none.   All other systems are reviewed and negative.    PHYSICAL EXAM: VS:  BP 125/73   Pulse Marland Kitchen)  56   Temp (!) 97.2 F (36.2 C)   Ht 5\' 11"  (1.803 m)   Wt 195 lb 12.8 oz (88.8 kg)   SpO2 95%   BMI 27.31 kg/m  , BMI Body mass index is 27.31 kg/m. GENERAL:  Well appearing NECK:  No jugular venous distention, waveform within normal limits, carotid upstroke brisk and symmetric, no bruits, no thyromegaly LUNGS:  Clear to auscultation bilaterally CHEST:  Unremarkable HEART:  PMI not displaced or sustained,S1 and S2 within normal limits, no S3, no S4, no clicks, no rubs, no murmurs ABD:  Flat, positive bowel sounds normal in frequency in pitch, no bruits, no rebound, no guarding, no midline pulsatile mass, no hepatomegaly, no splenomegaly EXT:  2 plus pulses throughout, no edema, no cyanosis no clubbing   EKG:  EKG is  ordered today. The ekg ordered today demonstrates sinus rhythm,  rate 56, sinus arrhythmia, axis within normal limits, intervals within normal limits, no acute ST-T wave changes.   Recent Labs: No results found for requested labs within last 8760 hours.    Lipid Panel    Component Value Date/Time   CHOL 130 10/18/2013 0816   TRIG 60.0 10/18/2013 0816   HDL 48.10 10/18/2013 0816   CHOLHDL 3 10/18/2013 0816   VLDL 12.0 10/18/2013 0816   LDLCALC 70 10/18/2013 0816      Wt Readings from Last 3 Encounters:  07/02/20 195 lb 12.8 oz (88.8 kg)  06/16/19 186 lb (84.4 kg)  11/22/18 198 lb (89.8 kg)      Other studies Reviewed: Additional studies/ records that were reviewed today include: Labs. Review of the above records demonstrates:  Please see elsewhere in the note.     ASSESSMENT AND PLAN:  ATRIAL FIB:  Douglas Rodriguez has a CHA2DS2 - VASc score of 2.    We again had a long discussion about his risk of  of stroke if he is having paroxysms of A. fib. He would consider starting back on an anticoagulant.  However, he wants to talk about it further after he quantifies this more.  He is going to get an [employer] to confirm that he is having atrial fibrillation and perhaps try to reduce the triggers and then we can decide on the risk benefits based on more information.   HTN:   His blood pressure is well controlled.  He will continue the meds as listed.  FATIGUE: This is a chronic complaint.  He had normal CBC and TSH recently drawn by his primary provider.  He does not sleep well which might contribute.  No further work-up is suggested.   Current medicines are reviewed at length with the patient today.  The patient does not have concerns regarding medicines.  The following changes have been made:  no change  Labs/ tests ordered today include: None  Orders Placed This Encounter  Procedures  . EKG 12-Lead     Disposition:   FU with me in 4 months to talk further about anticoagulation.  Signed, [name], MD  07/02/2020 3:20 PM     [facility]

## 2020-07-02 ENCOUNTER — Other Ambulatory Visit: Payer: Self-pay

## 2020-07-02 ENCOUNTER — Ambulatory Visit: Payer: Medicare Other | Admitting: Cardiology

## 2020-07-02 ENCOUNTER — Encounter: Payer: Self-pay | Admitting: Cardiology

## 2020-07-02 VITALS — BP 125/73 | HR 56 | Temp 97.2°F | Ht 71.0 in | Wt 195.8 lb

## 2020-07-02 DIAGNOSIS — I480 Paroxysmal atrial fibrillation: Secondary | ICD-10-CM | POA: Diagnosis not present

## 2020-07-02 DIAGNOSIS — R5383 Other fatigue: Secondary | ICD-10-CM | POA: Diagnosis not present

## 2020-07-02 DIAGNOSIS — I10 Essential (primary) hypertension: Secondary | ICD-10-CM | POA: Diagnosis not present

## 2020-07-02 NOTE — Patient Instructions (Signed)
Medication Instructions:  No changes *If you need a refill on your cardiac medications before your next appointment, please call your pharmacy*   Lab Work: None ordered If you have labs (blood work) drawn today and your tests are completely normal, you will receive your results only by: Marland Kitchen MyChart Message (if you have MyChart) OR . A paper copy in the mail If you have any lab test that is abnormal or we need to change your treatment, we will call you to review the results.   Testing/Procedures: None ordered   Follow-Up: At [facility], you and your health needs are our priority.  As part of our continuing mission to provide you with exceptional heart care, we have created designated Provider Care Teams.  These Care Teams include your primary Cardiologist (physician) and Advanced Practice Providers (APPs -  Physician Assistants and Nurse Practitioners) who all work together to provide you with the care you need, when you need it.  We recommend signing up for the patient portal called "MyChart".  Sign up information is provided on this After Visit Summary.  MyChart is used to connect with patients for Virtual Visits (Telemedicine).  Patients are able to view lab/test results, encounter notes, upcoming appointments, etc.  Non-urgent messages can be sent to your provider as well.   To learn more about what you can do with MyChart, go to [email or web address].    Your next appointment:   4 month(s)  The format for your next appointment:   In Person  Provider:   [name], MD   Other Instructions [employer] is the name of the ekg monitor company

## 2020-07-25 ENCOUNTER — Telehealth: Payer: Self-pay | Admitting: Cardiology

## 2020-07-25 NOTE — Telephone Encounter (Signed)
Douglas Rodriguez is calling requesting a response to his patient message he sent today, 07/25/20. Please advise.

## 2020-11-10 NOTE — Progress Notes (Signed)
Cardiology Office Note   Date:  11/12/2020   ID:  [name], DOB 1951-06-12, MRN [id number]  PCP:  [name]., MD  Cardiologist:   [name], MD   Chief Complaint  Patient presents with  . Atrial Fibrillation      History of Present Illness: Douglas Rodriguez is a 70 y.o. male who presents for follow up of atrial fib.  He had atrial fib ablation in 2012.  He had flutter with ablation in 2014.  He was having PAF at the last visit but he could clearly identify triggers.  Since last saw him he has done well.  He has had some paroxysms of atrial fibrillation.  He has copies on his [name] and I reviewed these today.  He says this happens sporadically.  It might last 1 to 4 hours.  It might happen every couple of weeks and then occur in clusters.  Has had some association with increased alcohol or caffeine but he really only now drinks very rarely has cut down caffeine.  He stays active.  He says he typically go to sleep and that the symptoms go away.  He is much better than before his A. fib or a flutter ablations.  He has had no presyncope or syncope.  He denies any chest pressure, neck or arm discomfort.  He had no weight gain or edema.  He walks couple miles a day.  Does a lot of golfing.  He does yard work.   Past Medical History:  Diagnosis Date  . Atrial fibrillation ([place])    s/p PVI by [name] 4/12  . Atrial flutter [facility])    s/p CTI ablation by Dr [name] 11/14  . Basal cell carcinoma   . 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 Hyperlipidemia   . Long term (current) use of anticoagulants 06/07/2013   Eliquis   . 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 Palpitation 09/14/2013  . PVC's (premature ventricular contractions)   . Squamous carcinoma     Past Surgical History:  Procedure Laterality Date  . ATRIAL FIBRILLATION ABLATION  12/11/10   PVI  ablation by JA  . ATRIAL FLUTTER ABLATION  07/20/2013   CTI by Dr [name]  . ATRIAL FLUTTER ABLATION N/A 07/20/2013   Procedure: ATRIAL FLUTTER ABLATION;  Surgeon: [name], MD;  Location: [place] CATH LAB;  Service: Cardiovascular;  Laterality: N/A;  . BASAL CELL CARCINOMA EXCISION     "upper lip & back"  . INGUINAL HERNIA REPAIR  [phone]  . KNEE ARTHROSCOPY Left ~ 2009  . SQUAMOUS CELL CARCINOMA EXCISION     "groin"  . TONSILECTOMY, ADENOIDECTOMY, BILATERAL MYRINGOTOMY AND TUBES    . TONSILLECTOMY       Current Outpatient Medications  Medication Sig Dispense Refill  . acetaminophen (TYLENOL) 500 MG tablet Take 1,000 mg by mouth every 6 (six) hours as needed for pain.    Marland Kitchen atorvastatin (LIPITOR) 10 MG tablet TAKE 1 TABLET BY MOUTH ONCE A DAY FOR CHOLESTEROL 30 tablet 0  . fluticasone (FLONASE) 50 MCG/ACT nasal spray Place 2 sprays into both nostrils daily.    Marland Kitchen olmesartan-hydrochlorothiazide (BENICAR HCT) 20-12.5 MG tablet Take 1 tablet by mouth daily.     No current facility-administered medications for this visit.    Allergies:   Patient has no known allergies.    ROS:  Please see  the history of present illness.   Otherwise, review of systems are positive for none.   All other systems are reviewed and negative.    PHYSICAL EXAM: VS:  BP 112/74   Pulse 60   Ht 5\' 11"  (1.803 m)   Wt 202 lb 9.6 oz (91.9 kg)   BMI 28.26 kg/m  , BMI Body mass index is 28.26 kg/m. GENERAL:  Well appearing NECK:  No jugular venous distention, waveform within normal limits, carotid upstroke brisk and symmetric, no bruits, no thyromegaly LUNGS:  Clear to auscultation bilaterally CHEST:  Unremarkable HEART:  PMI not displaced or sustained,S1 and S2 within normal limits, no S3, no S4, no clicks, no rubs, no murmurs ABD:  Flat, positive bowel sounds normal in frequency in pitch, no bruits, no rebound, no guarding, no midline pulsatile mass, no hepatomegaly, no splenomegaly EXT:  2 plus pulses  throughout, no edema, no cyanosis no clubbing   EKG:  EKG is not ordered today.   Recent Labs: No results found for requested labs within last 8760 hours.    Lipid Panel    Component Value Date/Time   CHOL 130 10/18/2013 0816   TRIG 60.0 10/18/2013 0816   HDL 48.10 10/18/2013 0816   CHOLHDL 3 10/18/2013 0816   VLDL 12.0 10/18/2013 0816   LDLCALC 70 10/18/2013 0816      Wt Readings from Last 3 Encounters:  11/12/20 202 lb 9.6 oz (91.9 kg)  07/02/20 195 lb 12.8 oz (88.8 kg)  06/16/19 186 lb (84.4 kg)      Other studies Reviewed: Additional studies/ records that were reviewed today include: K[place]ia, labs in [place]. Review of the above records demonstrates:  Please see elsewhere in the note.     ASSESSMENT AND PLAN:  ATRIAL FIB:  [name] has a CHA2DS2 - VASc score of 2.  He does get one point because he is on an antihypertensive although his blood pressure is borderline.  We had a long discussion about this again today.  I think it is a bit of a gray zone as to whether he absolutely needs a blood thinner based on recent data around paroxysmal A. fib and his borderline score of 2.  He very much would like to avoid additional medications.  Of note I do not think there is any strong indication for him to take an aspirin either in his situation.  Again we weighed the risks benefits and had patient activation involved in this decision making.  HTN:   His blood pressure is well controlled.  No change in therapy.   RISK REDUCTION: We had a long discussion about this.  His Framingham heart score is very low and so there is no indication for aspirin.  Current medicines are reviewed at length with the patient today.  The patient does not have concerns regarding medicines.  The following changes have been made:  None  Labs/ tests ordered today include: None  No orders of the defined types were placed in this encounter.    Disposition:   FU with me in 12  months.   Signed, [name], MD  11/12/2020 2:05 PM    [facility]

## 2020-11-12 ENCOUNTER — Encounter: Payer: Self-pay | Admitting: Cardiology

## 2020-11-12 ENCOUNTER — Other Ambulatory Visit: Payer: Self-pay

## 2020-11-12 ENCOUNTER — Ambulatory Visit: Payer: Medicare Other | Admitting: Cardiology

## 2020-11-12 VITALS — BP 112/74 | HR 60 | Ht 71.0 in | Wt 202.6 lb

## 2020-11-12 DIAGNOSIS — I480 Paroxysmal atrial fibrillation: Secondary | ICD-10-CM

## 2020-11-12 DIAGNOSIS — I10 Essential (primary) hypertension: Secondary | ICD-10-CM | POA: Diagnosis not present

## 2020-11-12 NOTE — Patient Instructions (Signed)
Medication Instructions:  STOP ASPIRIN   *If you need a refill on your cardiac medications before your next appointment, please call your pharmacy*  Lab Work: NONE  Testing/Procedures: NONE  Follow-Up: At [facility], you and your health needs are our priority.  As part of our continuing mission to provide you with exceptional heart care, we have created designated Provider Care Teams.  These Care Teams include your primary Cardiologist (physician) and Advanced Practice Providers (APPs -  Physician Assistants and Nurse Practitioners) who all work together to provide you with the care you need, when you need it.  We recommend signing up for the patient portal called "MyChart".  Sign up information is provided on this After Visit Summary.  MyChart is used to connect with patients for Virtual Visits (Telemedicine).  Patients are able to view lab/test results, encounter notes, upcoming appointments, etc.  Non-urgent messages can be sent to your provider as well.   To learn more about what you can do with MyChart, go to [email or web address].    Your next appointment:   12 month(s)  The format for your next appointment:   In Person  Provider:   You may see [name], MD or one of the following Advanced Practice Providers on your designated Care Team:    [place], PA-C  Coletta Memos, FNP

## 2021-01-23 ENCOUNTER — Emergency Department (HOSPITAL_BASED_OUTPATIENT_CLINIC_OR_DEPARTMENT_OTHER)
Admission: EM | Admit: 2021-01-23 | Discharge: 2021-01-23 | Disposition: A | Discharge status: Home / Self Care | Payer: Medicare Other | Attending: Emergency Medicine | Admitting: Emergency Medicine

## 2021-01-23 ENCOUNTER — Telehealth: Payer: Self-pay | Admitting: Cardiology

## 2021-01-23 ENCOUNTER — Encounter (HOSPITAL_BASED_OUTPATIENT_CLINIC_OR_DEPARTMENT_OTHER): Payer: Self-pay

## 2021-01-23 ENCOUNTER — Emergency Department (HOSPITAL_BASED_OUTPATIENT_CLINIC_OR_DEPARTMENT_OTHER): Payer: Medicare Other

## 2021-01-23 ENCOUNTER — Other Ambulatory Visit: Payer: Self-pay

## 2021-01-23 DIAGNOSIS — I480 Paroxysmal atrial fibrillation: Secondary | ICD-10-CM | POA: Diagnosis not present

## 2021-01-23 DIAGNOSIS — I10 Essential (primary) hypertension: Secondary | ICD-10-CM | POA: Diagnosis not present

## 2021-01-23 DIAGNOSIS — Z79899 Other long term (current) drug therapy: Secondary | ICD-10-CM | POA: Diagnosis not present

## 2021-01-23 DIAGNOSIS — R002 Palpitations: Secondary | ICD-10-CM | POA: Diagnosis present

## 2021-01-23 LAB — CBC
HCT: 50.3 % (ref 39.0–52.0)
Hemoglobin: 17.6 g/dL — ABNORMAL HIGH (ref 13.0–17.0)
MCH: 31.3 pg (ref 26.0–34.0)
MCHC: 35 g/dL (ref 30.0–36.0)
MCV: 89.5 fL (ref 80.0–100.0)
Platelets: 214 10*3/uL (ref 150–400)
RBC: 5.62 MIL/uL (ref 4.22–5.81)
RDW: 12.4 % (ref 11.5–15.5)
WBC: 11.8 10*3/uL — ABNORMAL HIGH (ref 4.0–10.5)
nRBC: 0 % (ref 0.0–0.2)

## 2021-01-23 LAB — BASIC METABOLIC PANEL
Anion gap: 9 (ref 5–15)
BUN: 21 mg/dL (ref 8–23)
CO2: 19 mmol/L — ABNORMAL LOW (ref 22–32)
Calcium: 8.9 mg/dL (ref 8.9–10.3)
Chloride: 107 mmol/L (ref 98–111)
Creatinine, Ser: 1.18 mg/dL (ref 0.61–1.24)
GFR, Estimated: >60 mL/min (ref >60)
Glucose, Bld: 114 mg/dL — ABNORMAL HIGH (ref 70–99)
Potassium: 4.4 mmol/L (ref 3.5–5.1)
Sodium: 135 mmol/L (ref 135–145)

## 2021-01-23 LAB — TROPONIN I (HIGH SENSITIVITY): Troponin I (High Sensitivity): 3 ng/L (ref <18)

## 2021-01-23 NOTE — Telephone Encounter (Signed)
Patient c/o Palpitations:  High priority if patient c/o lightheadedness, shortness of breath, or chest pain  1) How long have you had palpitations/irregular HR/ Afib? Are you having the symptoms now? Yes, HR 156  2) Are you currently experiencing lightheadedness, SOB or CP? lightheadness  3) Do you have a history of afib (atrial fibrillation) or irregular heart rhythm? Yes  4) Have you checked your BP or HR? (document readings if available): Pt has not checked his BP or HR, he wants to be left alone so he can sleep it off per wife  5) Are you experiencing any other symptoms? lightheadedness

## 2021-01-23 NOTE — Progress Notes (Signed)
Cardiology Office Note   Date:  01/24/2021   ID:  Douglas Rodriguez, DOB October 10, 1950, MRN [id number]  PCP:  [name]., MD  Cardiologist:   [name], MD   Chief Complaint  Patient presents with  . Atrial Fibrillation      History of Present Illness: Douglas Rodriguez is a 70 y.o. male who presents for follow up of atrial fib.  He had atrial fib ablation in 2012.  He had flutter with ablation in 2014.  He called the other day and was having atrial fib.  He went to the emergency room.  I reviewed these records.  He actually was in A. fib for about 6 hours.  He went up to the 160s.  I did review his Watch.  He seems to know when he is in this rhythm.  He has a quite frequently every couple of weeks.  Its not as bad as when he had his ablations.  He is denying any new chest pressure, neck or arm discomfort.  He had no new shortness of breath, PND or orthopnea.  Of note his blood pressures have been running well with systolics occasionally below 90.  Past Medical History:  Diagnosis Date  . Atrial fibrillation ([place])    s/p PVI by [facility] 4/12  . Atrial flutter [facility])    s/p CTI ablation by Dr [name] 11/14  . Basal cell carcinoma   . 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 Hyperlipidemia   . Long term (current) use of anticoagulants 06/07/2013   Eliquis   . 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 Palpitation 09/14/2013  . PVC's (premature ventricular contractions)   . Squamous carcinoma     Past Surgical History:  Procedure Laterality Date  . ATRIAL FIBRILLATION ABLATION  12/11/10   PVI ablation by JA  . ATRIAL FLUTTER ABLATION  07/20/2013   CTI by Dr [name]  . ATRIAL FLUTTER ABLATION N/A 07/20/2013   Procedure: ATRIAL FLUTTER ABLATION;  Surgeon: [name], MD;  Location: [place] CATH LAB;  Service: Cardiovascular;  Laterality: N/A;  . BASAL CELL CARCINOMA  EXCISION     "upper lip & back"  . INGUINAL HERNIA REPAIR  [phone]  . KNEE ARTHROSCOPY Left ~ 2009  . SQUAMOUS CELL CARCINOMA EXCISION     "groin"  . TONSILECTOMY, ADENOIDECTOMY, BILATERAL MYRINGOTOMY AND TUBES    . TONSILLECTOMY       Current Outpatient Medications  Medication Sig Dispense Refill  . acetaminophen (TYLENOL) 500 MG tablet Take 1,000 mg by mouth every 6 (six) hours as needed for pain.    Marland Kitchen atorvastatin (LIPITOR) 10 MG tablet TAKE 1 TABLET BY MOUTH ONCE A DAY FOR CHOLESTEROL 30 tablet 0  . diltiazem (CARDIZEM) 30 MG tablet Take 1 tablet (30 mg total) by mouth every 8 (eight) hours as needed (FOR AFIB). 30 tablet 5  . fluticasone (FLONASE) 50 MCG/ACT nasal spray Place 2 sprays into both nostrils daily.    Marland Kitchen olmesartan (BENICAR) 20 MG tablet Take 1 tablet (20 mg total) by mouth daily. 90 tablet 1   No current facility-administered medications for this visit.    Allergies:   Patient has no known allergies.    ROS:  Please see the history of present illness.   Otherwise, review of systems are positive for none.   All other systems are reviewed and negative.  PHYSICAL EXAM: VS:  BP 108/84   Pulse 64   Ht 5\' 11"  (1.803 m)   Wt 198 lb (89.8 kg)   SpO2 96%   BMI 27.62 kg/m  , BMI Body mass index is 27.62 kg/m. GENERAL:  Well appearing NECK:  No jugular venous distention, waveform within normal limits, carotid upstroke brisk and symmetric, no bruits, no thyromegaly LUNGS:  Clear to auscultation bilaterally CHEST:  Unremarkable HEART:  PMI not displaced or sustained,S1 and S2 within normal limits, no S3, no S4, no clicks, no rubs, no murmurs ABD:  Flat, positive bowel sounds normal in frequency in pitch, no bruits, no rebound, no guarding, no midline pulsatile mass, no hepatomegaly, no splenomegaly EXT:  2 plus pulses throughout, no edema, no cyanosis no clubbing    EKG:  EKG is ordered today. Sinus rhythm, rate 64, axis, intervals within normal limits, no  acute ST-T wave changes.  Recent Labs: 01/23/2021: BUN 21; Creatinine, Ser 1.18; Hemoglobin 17.6; Platelets 214; Potassium 4.4; Sodium 135    Lipid Panel    Component Value Date/Time   CHOL 130 10/18/2013 0816   TRIG 60.0 10/18/2013 0816   HDL 48.10 10/18/2013 0816   CHOLHDL 3 10/18/2013 0816   VLDL 12.0 10/18/2013 0816   LDLCALC 70 10/18/2013 0816      Wt Readings from Last 3 Encounters:  01/24/21 198 lb (89.8 kg)  01/23/21 192 lb (87.1 kg)  11/12/20 202 lb 9.6 oz (91.9 kg)      Other studies Reviewed: Additional studies/ records that were reviewed today include:  ED records Review of the above records demonstrates:  Please see elsewhere in the note.     ASSESSMENT AND PLAN:  ATRIAL FIB:  Douglas Rodriguez has a CHA2DS2 - VASc score of 2.     I called Dr. [name] to discuss this this morning.  I considered wanting to use Multaq.  However, the cost would probably be prohibitive.  The patient does not tolerate flecainide.   We are can use Cardizem as needed just to see if this helps symptomatically.  Of note he does want to be on Eliquis which is absolutely necessary and he really only has Douglas Rodriguez has a CHA2DS2 - VASc score of 1.  He is not really caring a diagnosis of hypertension currently.  He strongly wants to avoid most medications.  He might consider another ablation if this gets really bad.  He would let me know.  Of note thyroid was normal in September  HTN:   His blood pressure is running low.  I am going to stop the HCT component of Benicar and continue Benicar only.    Current medicines are reviewed at length with the patient today.  The patient does not have concerns regarding medicines.  The following changes have been made: As above  Labs/ tests ordered today include:   None  Orders Placed This Encounter  Procedures  . EKG 12-Lead     Disposition:   FU with me in 3 months.   Signed, [name], MD  01/24/2021 9:34 AM    [facility]

## 2021-01-23 NOTE — ED Provider Notes (Signed)
[facility] Provider Note   CSN: [id number] Arrival date & time: 01/23/21  1114     History Chief Complaint  Patient presents with  . Palpitations    Douglas Rodriguez is a 70 y.o. male.  The history is provided by the patient and medical records.  Palpitations  Douglas Rodriguez is a 70 y.o. male who presents to the Emergency Department complaining of afib. He has a history of paroxysmal a fib status post ablation in 2012 with recurrent a flutter that required ablation in 2014. He now has occasional episodes that usually self terminated when he takes Tylenol at home. Last night he started feeling queasy and unwell like is a fib. Today he took his dog for a walk and when he got home he felt like is gonna pass out. He uses home cardio mobile device and it recorded a fib on three separate episodes over three hours heart rate ranging from 150 to 160. He has recently been ill with nasal, ear and throat congestion and just completed a course of doxycycline yesterday. Overall he states that he is feeling improved from those symptoms. No associated shortness of breath, chest pain.     Past Medical History:  Diagnosis Date  . Atrial fibrillation ([place])    s/p PVI by [facility] 4/12  . Atrial flutter [facility])    s/p CTI ablation by Dr [name] 11/14  . Basal cell carcinoma   . 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 Hyperlipidemia   . Long term (current) use of anticoagulants 06/07/2013   Eliquis   . 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 Palpitation 09/14/2013  . PVC's (premature ventricular contractions)   . Squamous carcinoma     Patient Active Problem List   Diagnosis Date Noted  . Essential hypertension 07/01/2020  . Fatigue 07/01/2020  . Palpitation 09/14/2013  . Long term (current) use of anticoagulants 06/07/2013    Class: Acute  . Atrial  flutter ([place]) 06/07/2013  . PAF (paroxysmal atrial fibrillation) ([place]) 10/23/2010  . 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 ATRIAL FIBRILLATION ABLATION  12/11/10   PVI ablation by JA  . ATRIAL FLUTTER ABLATION  07/20/2013   CTI by Dr [name]  . ATRIAL FLUTTER ABLATION N/A 07/20/2013   Procedure: ATRIAL FLUTTER ABLATION;  Surgeon: [name], MD;  Location: [facility] CATH LAB;  Service: Cardiovascular;  Laterality: N/A;  . BASAL CELL CARCINOMA EXCISION     "upper lip & back"  . INGUINAL HERNIA REPAIR  [phone]  . KNEE ARTHROSCOPY Left ~ 2009  . SQUAMOUS CELL CARCINOMA EXCISION     "groin"  . TONSILECTOMY, ADENOIDECTOMY, BILATERAL MYRINGOTOMY AND TUBES    . TONSILLECTOMY         Family History  Problem Relation Age of Onset  . Hypertension Neg Hx   . Hyperlipidemia Neg Hx   . Heart failure Neg Hx   . Heart disease Neg Hx   . Heart attack Neg Hx     Social History   Tobacco Use  . Smoking status: Never Smoker  . Smokeless tobacco: Never Used  Substance Use Topics  . Alcohol use: Yes    Alcohol/week: 1.0 standard drink    Types: 1 Cans of beer per week  . Drug use: No    Home Medications Prior to Admission  medications   Medication Sig Start Date End Date Taking? Authorizing Provider  acetaminophen (TYLENOL) 500 MG tablet Take 1,000 mg by mouth every 6 (six) hours as needed for pain.   Yes [provider]  atorvastatin (LIPITOR) 10 MG tablet TAKE 1 TABLET BY MOUTH ONCE A DAY FOR CHOLESTEROL 04/13/16  Yes Belva Crome, MD  fluticasone [facility]) 50 MCG/ACT nasal spray Place 2 sprays into both nostrils daily.   Yes [provider]  olmesartan-hydrochlorothiazide (BENICAR HCT) 20-12.5 MG tablet Take 1 tablet by mouth daily.   Yes [provider]    Allergies    Patient has no known allergies.  Review of Systems   Review of Systems  Cardiovascular: Positive for palpitations.  All  other systems reviewed and are negative.   Physical Exam Updated Vital Signs BP 103/71   Pulse 71   Temp 98.2 F (36.8 C) (Oral)   Resp 13   Ht 5\' 11"  (1.803 m)   Wt 87.1 kg   SpO2 96%   BMI 26.78 kg/m   Physical Exam Vitals and nursing note reviewed.  Constitutional:      Appearance: He is well-developed.  HENT:     Head: Normocephalic and atraumatic.  Cardiovascular:     Rate and Rhythm: Normal rate and regular rhythm.     Heart sounds: No murmur heard.   Pulmonary:     Effort: Pulmonary effort is normal. No respiratory distress.     Breath sounds: Normal breath sounds.  Abdominal:     Palpations: Abdomen is soft.     Tenderness: There is no abdominal tenderness. There is no guarding or rebound.  Musculoskeletal:        General: No swelling or tenderness.  Skin:    General: Skin is warm and dry.  Neurological:     Mental Status: He is alert and oriented to person, place, and time.  Psychiatric:        Behavior: Behavior normal.     ED Results / Procedures / Treatments   Labs (all labs ordered are listed, but only abnormal results are displayed) Labs Reviewed  BASIC METABOLIC PANEL - Abnormal; Notable for the following components:      Result Value   CO2 19 (*)    Glucose, Bld 114 (*)    All other components within normal limits  CBC - Abnormal; Notable for the following components:   WBC 11.8 (*)    Hemoglobin 17.6 (*)    All other components within normal limits  TROPONIN I (HIGH SENSITIVITY)    EKG EKG Interpretation  Date/Time:  Thursday Jan 23 2021 11:22:58 EDT Ventricular Rate:  101 PR Interval:  168 QRS Duration: 86 QT Interval:  334 QTC Calculation: 433 R Axis:   73 Text Interpretation: Sinus tachycardia Cannot rule out Anterior infarct , age undetermined Abnormal ECG Confirmed by Quintella Reichert [id number]) on 01/23/2021 11:31:42 AM   Radiology DG Chest 2 View  Result Date: 01/23/2021 CLINICAL DATA:  Palpitations, lightheadedness EXAM:  CHEST - 2 VIEW COMPARISON:  None. FINDINGS: The heart size and mediastinal contours are within normal limits. Both lungs are clear. The visualized skeletal structures are unremarkable. IMPRESSION: No acute abnormality of the lungs. Electronically Signed   By: Eddie Candle M.D.   On: 01/23/2021 12:20    Procedures Procedures   Medications Ordered in ED Medications - No data to display  ED Course  I have reviewed the triage vital signs and the nursing notes.  Pertinent labs &  imaging results that were available during my care of the patient were reviewed by me and considered in my medical decision making (see chart for details).    MDM Rules/Calculators/A&P                          pt with hx/o paraxosymal afib here for sxs of recurrent afib - converted on ED arrival. He is asymptomatic in the emergency department. He does have occasional PACs on telemetry but no recurrent a fib during ED stay. Labs without significant electrolyte abnormality. Plan to discharge home with close cardiology follow-up and return precautions.   CHA2DS2/VAS Stroke Risk Points  Current as of 26 minutes ago     2 >= 2 Points: High Risk  1 - 1.99 Points: Medium Risk  0 Points: Low Risk    Last Change: N/A      Details    This score determines the patient's risk of having a stroke if the  patient has atrial fibrillation.       Points Metrics  0 Has Congestive Heart Failure:  No    Current as of 26 minutes ago  0 Has Vascular Disease:  No    Current as of 26 minutes ago  1 Has Hypertension:  Yes    Current as of 26 minutes ago  1 Age:  64    Current as of 26 minutes ago  0 Has Diabetes:  No    Current as of 26 minutes ago  0 Had Stroke:  No  Had TIA:  No  Had Thromboembolism:  No    Current as of 26 minutes ago  0 Male:  No    Current as of 26 minutes ago           Final Clinical Impression(s) / ED Diagnoses Final diagnoses:  Paroxysmal atrial fibrillation [facility])    Rx / DC Orders ED  Discharge Orders    None       Quintella Reichert, MD 01/23/21 1533

## 2021-01-23 NOTE — Telephone Encounter (Signed)
S/w wife she states that pt is taking a nap right now and she asked him before he laid down and he states just "leave hm alone" and this should be gone after he wakes. Wife states that this has been going on since last night so, this is why she is calling. Pt feels lightheaded. And his AFIB is acting up. Pt declines ER for now and would like to see if there is an appointment available today or tomorrow. We have no appts today. Found an appointment w/Dr Percival Spanish for tomorrow @820am . She states that she may still take him to the ER today as they are going to the beach tomorrow. She will call back if they go to the ER so we can get someone else in that slot. Informed that if this worsens to go to the ER. Wife verbalizes understanding.

## 2021-01-23 NOTE — ED Triage Notes (Signed)
Pt reports palpitations beginning last night, HR 150s at home with hx a-fib.  Called cardiologist who made appointment for tomorrow, instructed to come to ED if not resolving on its own.  Pt states usually takes tylenol and goes back to normal.  On arrival HR 101, pt states almost completely resloved, but episode lasted longer than usual.  Denies chest pain at this time.  Mild dizziness reported this morning when walking dog.

## 2021-01-24 ENCOUNTER — Ambulatory Visit: Payer: Medicare Other | Admitting: Cardiology

## 2021-01-24 ENCOUNTER — Encounter: Payer: Self-pay | Admitting: Cardiology

## 2021-01-24 VITALS — BP 108/84 | HR 64 | Ht 71.0 in | Wt 198.0 lb

## 2021-01-24 DIAGNOSIS — I480 Paroxysmal atrial fibrillation: Secondary | ICD-10-CM | POA: Diagnosis not present

## 2021-01-24 MED ORDER — OLMESARTAN MEDOXOMIL 20 MG PO TABS: 20.0000 mg | ORAL_TABLET | Freq: Every day | ORAL | 1 refills | Status: DC

## 2021-01-24 MED ORDER — DILTIAZEM HCL 30 MG PO TABS: 30.0000 mg | ORAL_TABLET | Freq: Three times a day (TID) | ORAL | 5 refills | Status: DC | PRN

## 2021-01-24 NOTE — Patient Instructions (Addendum)
Medication Instructions:  STOP OLMESARTAN HCT   START OLEMSARTAN 20 MG DAILY   USE DILTIAZEM 30 MG EVERY 8 HOURS AS NEEDED   *If you need a refill on your cardiac medications before your next appointment, please call your pharmacy*  Lab Work: NONE  Testing/Procedures: NONE  Follow-Up: At [facility], you and your health needs are our priority.  As part of our continuing mission to provide you with exceptional heart care, we have created designated Provider Care Teams.  These Care Teams include your primary Cardiologist (physician) and Advanced Practice Providers (APPs -  Physician Assistants and Nurse Practitioners) who all work together to provide you with the care you need, when you need it.  We recommend signing up for the patient portal called "MyChart".  Sign up information is provided on this After Visit Summary.  MyChart is used to connect with patients for Virtual Visits (Telemedicine).  Patients are able to view lab/test results, encounter notes, upcoming appointments, etc.  Non-urgent messages can be sent to your provider as well.   To learn more about what you can do with MyChart, go to [email or web address].    Your next appointment:   3 month(s)  The format for your next appointment:   In Person  Provider:   You may see [name], MD or one of the following Advanced Practice Providers on your designated Care Team:    Rosaria Ferries, PA-C  Jory Sims, DNP, ANP

## 2021-04-28 NOTE — Progress Notes (Signed)
Cardiology Office Note   Date:  04/29/2021   ID:  Douglas Rodriguez, DOB September 05, 1951, MRN [id number]  PCP:  [name]., MD  Cardiologist:   [name], MD   Chief Complaint  Patient presents with   Atrial Fibrillation       History of Present Illness: Douglas Rodriguez is a 70 y.o. male who presents for follow up of atrial fib.  He had atrial fib ablation in 2012.  He had flutter with ablation in 2014.  I saw him in May after he had atrial fib.  He went to the emergency room.   He actually was in A. fib for about 6 hours.  He went up to the 160s.    I gave him as needed Cardizem and he is use this 7-8 times since I last saw him and it breaks his atrial fibrillation more than an hour.  He does well with this.  He denies any new presyncope or syncope.  He had no chest pressure, neck or arm discomfort.  He has no shortness of breath, PND or orthopnea.  He has had no weight gain or edema.   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   Qualifier: Diagnosis of  By: [name], MD, Chaz Mcglasson     Hyperlipidemia    Long term (current) use of anticoagulants 06/07/2013   Eliquis    MRSA (methicillin resistant staph aureus) culture positive    colonization discovered 4/12   Palpitation 09/14/2013   PVC's (premature ventricular contractions)    Squamous carcinoma     Past Surgical History:  Procedure Laterality Date   ATRIAL FIBRILLATION ABLATION  12/11/10   PVI ablation by [facility]  07/20/2013   CTI by Dr [name]   ATRIAL FLUTTER ABLATION N/A 07/20/2013   Procedure: ATRIAL FLUTTER ABLATION;  Surgeon: [name], MD;  Location: [facility] CATH LAB;  Service: Cardiovascular;  Laterality: N/A;   BASAL CELL CARCINOMA EXCISION     "upper lip & back"   INGUINAL HERNIA REPAIR  [phone]    KNEE ARTHROSCOPY Left ~ 2009   SQUAMOUS CELL CARCINOMA EXCISION     "groin"   TONSILECTOMY, ADENOIDECTOMY, BILATERAL MYRINGOTOMY AND TUBES     TONSILLECTOMY       Current Outpatient Medications  Medication Sig Dispense Refill   acetaminophen (TYLENOL) 500 MG tablet Take 1,000 mg by mouth every 6 (six) hours as needed for pain.     atorvastatin (LIPITOR) 10 MG tablet TAKE 1 TABLET BY MOUTH ONCE A DAY FOR CHOLESTEROL 30 tablet 0   diltiazem (CARDIZEM) 30 MG tablet Take 1 tablet (30 mg total) by mouth every 8 (eight) hours as needed (FOR AFIB). 30 tablet 5   fluticasone (FLONASE) 50 MCG/ACT nasal spray Place 2 sprays into both nostrils daily.     olmesartan (BENICAR) 20 MG tablet Take 1 tablet (20 mg total) by mouth daily. 90 tablet 1   No current facility-administered medications for this visit.    Allergies:   Patient has no known allergies.    ROS:  Please see the history of present illness.   Otherwise, review of systems are positive for none.   All other systems are reviewed and negative.    PHYSICAL EXAM:  VS:  BP 115/72   Pulse 62   Ht '5\' 11"'$  (1.803 m)   Wt 195 lb 9.6 oz (88.7 kg)   SpO2 95%   BMI 27.28 kg/m  , BMI Body mass index is 27.28 kg/m. GENERAL:  Well appearing NECK:  No jugular venous distention, waveform within normal limits, carotid upstroke brisk and symmetric, no bruits, no thyromegaly LUNGS:  Clear to auscultation bilaterally CHEST:  Unremarkable HEART:  PMI not displaced or sustained,S1 and S2 within normal limits, no S3, no S4, no clicks, no rubs, no murmurs ABD:  Flat, positive bowel sounds normal in frequency in pitch, no bruits, no rebound, no guarding, no midline pulsatile mass, no hepatomegaly, no splenomegaly EXT:  2 plus pulses throughout, no edema, no cyanosis no clubbing  EKG:  EKG is  ordered today. Sinus rhythm, rate 62, axis, intervals within normal limits, no acute ST-T wave changes.  Recent Labs: 01/23/2021: BUN 21; Creatinine, Ser 1.18;  Hemoglobin 17.6; Platelets 214; Potassium 4.4; Sodium 135    Lipid Panel    Component Value Date/Time   CHOL 130 10/18/2013 0816   TRIG 60.0 10/18/2013 0816   HDL 48.10 10/18/2013 0816   CHOLHDL 3 10/18/2013 0816   VLDL 12.0 10/18/2013 0816   LDLCALC 70 10/18/2013 0816      Wt Readings from Last 3 Encounters:  04/29/21 195 lb 9.6 oz (88.7 kg)  01/24/21 198 lb (89.8 kg)  01/23/21 192 lb (87.1 kg)      Other studies Reviewed: Additional studies/ records that were reviewed today include:  None Review of the above records demonstrates:  NA   ASSESSMENT AND PLAN:  ATRIAL FIB:  [name] has a CHA2DS2 - VASc score of 2.    I considered wanting to use Multaq.  However, the cost would probably be prohibitive.  The patient does not tolerate flecainide.   We were using Cardizem for symptomatic paroxysms.   Of note he does want to be on Eliquis which is not absolutely necessary and he really only has CHA2DS2 - VASc score of 1.    He is doing well with pill in pocket Cardizem and no change in therapy is indicated.  HTN:   His blood pressure is OK.  At the last visit I stopped his HCTZ and continue the Benicar only.  He has done well with this.   Current medicines are reviewed at length with the patient today.  The patient does not have concerns regarding medicines.  The following changes have been made: None  Labs/ tests ordered today include:   None  Orders Placed This Encounter  Procedures   EKG 12-Lead      Disposition:   FU with me in 12 months.   Signed, [name], MD  04/29/2021 2:45 PM    [facility]

## 2021-04-29 ENCOUNTER — Ambulatory Visit: Payer: Medicare Other | Admitting: Cardiology

## 2021-04-29 ENCOUNTER — Encounter: Payer: Self-pay | Admitting: Cardiology

## 2021-04-29 ENCOUNTER — Other Ambulatory Visit: Payer: Self-pay

## 2021-04-29 VITALS — BP 115/72 | HR 62 | Ht 71.0 in | Wt 195.6 lb

## 2021-04-29 DIAGNOSIS — I480 Paroxysmal atrial fibrillation: Secondary | ICD-10-CM

## 2021-04-29 DIAGNOSIS — I10 Essential (primary) hypertension: Secondary | ICD-10-CM | POA: Diagnosis not present

## 2021-04-29 NOTE — Patient Instructions (Signed)
Medication Instructions:  Your physician recommends that you continue on your current medications as directed. Please refer to the Current Medication list given to you today.  *If you need a refill on your cardiac medications before your next appointment, please call your pharmacy*   Follow-Up: At [facility], you and your health needs are our priority.  As part of our continuing mission to provide you with exceptional heart care, we have created designated Provider Care Teams.  These Care Teams include your primary Cardiologist (physician) and Advanced Practice Providers (APPs -  Physician Assistants and Nurse Practitioners) who all work together to provide you with the care you need, when you need it.  We recommend signing up for the patient portal called "MyChart".  Sign up information is provided on this After Visit Summary.  MyChart is used to connect with patients for Virtual Visits (Telemedicine).  Patients are able to view lab/test results, encounter notes, upcoming appointments, etc.  Non-urgent messages can be sent to your provider as well.   To learn more about what you can do with MyChart, go to [email or web address].    Your next appointment:   12 month(s)  The format for your next appointment:   In Person  Provider:   You may see [name], MD or one of the following Advanced Practice Providers on your designated Care Team:   Rosaria Ferries, PA-C Caron Presume, PA-C Jory Sims, DNP, ANP   Other Instructions

## 2021-06-30 ENCOUNTER — Telehealth: Payer: Self-pay | Admitting: Cardiology

## 2021-06-30 NOTE — Telephone Encounter (Signed)
Patient c/o Palpitations:  High priority if patient c/o lightheadedness, shortness of breath, or chest pain  How long have you had palpitations/irregular HR/ Afib? Are you having the symptoms now? Friday until this morning, no  Are you currently experiencing lightheadedness, SOB or CP? no  Do you have a history of afib (atrial fibrillation) or irregular heart rhythm? yes  Have you checked your BP or HR? (document readings if available): HR 80 now  Are you experiencing any other symptoms? no   Patient states he was usually able to keep his afib under control, but this weekend it lasted longer. He says he had it starting Friday and was not able to get it under control until this morning. He says he wants to know if he needs to take an extra diltiazem for it. He states he is not having any symptoms now.

## 2021-06-30 NOTE — Telephone Encounter (Signed)
Returned call to patient of Dr. Percival Spanish  He reports he had AFib starting on Friday, Saturday, Sunday He is back in rhythm now He has a kardiamobile monitor He has PRN diltiazem, which normally works for him but he had to take consistently this weekend He would like to know if he can increase dose of PRN diltiazem, if any other changes need to be made He states he is not on anticoagulation  Routed to Dr. Percival Spanish to review

## 2021-07-01 MED ORDER — DILTIAZEM HCL 30 MG PO TABS: 30.0000 mg | ORAL_TABLET | Freq: Three times a day (TID) | ORAL | 5 refills | Status: DC | PRN

## 2021-07-01 NOTE — Telephone Encounter (Signed)
[name], MD  You 2 hours ago (11:26 AM)   His PRN can be 60 mg prn and if he is having lots of paroxysms we can change it to a long acting once daily.      Spoke with patient and relayed message from Dr. Percival Spanish. He voiced understanding. Med list updated

## 2021-09-09 ENCOUNTER — Encounter: Payer: Self-pay | Admitting: Cardiology

## 2021-09-09 ENCOUNTER — Other Ambulatory Visit: Payer: Self-pay

## 2021-09-09 ENCOUNTER — Telehealth: Payer: Self-pay | Admitting: Cardiology

## 2021-09-09 ENCOUNTER — Ambulatory Visit: Payer: Medicare Other | Admitting: Cardiology

## 2021-09-09 VITALS — BP 110/83 | HR 92 | Ht 71.0 in | Wt 199.2 lb

## 2021-09-09 DIAGNOSIS — I480 Paroxysmal atrial fibrillation: Secondary | ICD-10-CM

## 2021-09-09 MED ORDER — APIXABAN 5 MG PO TABS: 5.0000 mg | ORAL_TABLET | Freq: Two times a day (BID) | ORAL | 11 refills | Status: DC

## 2021-09-09 MED ORDER — DILTIAZEM HCL 30 MG PO TABS: 60.0000 mg | ORAL_TABLET | Freq: Three times a day (TID) | ORAL | 11 refills | Status: DC

## 2021-09-09 NOTE — Patient Instructions (Addendum)
Medication Instructions:  Stop  taking benicar. Take cardizem 60 mg (2 tablets) three times a day. Take eliquis 5 mg twice a day. *If you need a refill on your cardiac medications before your next appointment, please call your pharmacy*   Lab Work: BMET Magnesium If you have labs (blood work) drawn today and your tests are completely normal, you will receive your results only by: Friend (if you have MyChart) OR A paper copy in the mail If you have any lab test that is abnormal or we need to change your treatment, we will call you to review the results.   Follow-Up: At [facility], you and your health needs are our priority.  As part of our continuing mission to provide you with exceptional heart care, we have created designated Provider Care Teams.  These Care Teams include your primary Cardiologist (physician) and Advanced Practice Providers (APPs -  Physician Assistants and Nurse Practitioners) who all work together to provide you with the care you need, when you need it.  We recommend signing up for the patient portal called "MyChart".  Sign up information is provided on this After Visit Summary.  MyChart is used to connect with patients for Virtual Visits (Telemedicine).  Patients are able to view lab/test results, encounter notes, upcoming appointments, etc.  Non-urgent messages can be sent to your provider as well.   To learn more about what you can do with MyChart, go to [email or web address].    Your next appointment:    In 10 days with afib clinic   Other Instructions Will make appointment in 10 days with afib clinic.

## 2021-09-09 NOTE — Progress Notes (Signed)
Cardiology Office Note   Date:  09/09/2021   ID:  Douglas Rodriguez, DOB [birth year], MRN [id number]  PCP:  [name]., MD  Cardiologist:   [name], MD   Chief Complaint  Patient presents with   Atrial Fibrillation     History of Present Illness: Douglas Rodriguez is a 71 y.o. male who presents for follow up of atrial fib.  He had atrial fib ablation in 2012.  He had flutter with ablation in 2014.  I saw him in May after he had atrial fib.  He went to the emergency room.   He actually was in A. fib for about 6 hours.  He went up to the 160s.   I have given him as needed Cardizem.  I saw him in follow-up in August and he was doing well at that point.  However, he was added to my schedule today because he thinks he is having increasing tachypalpitations.  He says he has been in atrial fibrillation since New Year's Eve.  There were no triggers.  He avoided alcohol.  He can just feel it and he verifies it on his device.  I reviewed this and he indeed is in atrial fibrillation.  He just feels it in his chest.  He thinks its been constant.  He feels a little bit weak and lightheaded.  He not had any presyncope or syncope.  He has not had any chest pressure, neck or arm discomfort.  He has had no weight gain or edema.   Past Medical History:  Diagnosis Date   Atrial fibrillation ([place])    s/p PVI by [facility] 4/12   Atrial flutter [facility])    s/p CTI ablation by Dr [name] 11/14   Basal cell carcinoma    BRADYCARDIA-TACHYCARDIA SYNDROME 10/23/2010   Qualifier: Diagnosis of  By: [name], MD, Ashlee Bewley     Hyperlipidemia    Long term (current) use of anticoagulants 06/07/2013   Eliquis    MRSA (methicillin resistant staph aureus) culture positive    colonization discovered 4/12   Palpitation 09/14/2013   PVC's (premature ventricular contractions)    Squamous carcinoma     Past Surgical History:  Procedure Laterality Date   ATRIAL FIBRILLATION ABLATION  12/11/10   PVI ablation by [place]  07/20/2013   CTI by Dr [name]   ATRIAL FLUTTER ABLATION N/A 07/20/2013   Procedure: ATRIAL FLUTTER ABLATION;  Surgeon: [name], MD;  Location: [place] CATH LAB;  Service: Cardiovascular;  Laterality: N/A;   BASAL CELL CARCINOMA EXCISION     "upper lip & back"   INGUINAL HERNIA REPAIR  [phone]   KNEE ARTHROSCOPY Left ~ 2009   SQUAMOUS CELL CARCINOMA EXCISION     "groin"   TONSILECTOMY, ADENOIDECTOMY, BILATERAL MYRINGOTOMY AND TUBES     TONSILLECTOMY       Current Outpatient Medications  Medication Sig Dispense Refill   acetaminophen (TYLENOL) 500 MG tablet Take 1,000 mg by mouth every 6 (six) hours as needed for pain.     apixaban (ELIQUIS) 5 MG TABS tablet Take 1 tablet (5 mg total) by mouth 2 (two) times daily. 60 tablet 11   atorvastatin (LIPITOR) 10 MG tablet TAKE 1 TABLET BY MOUTH ONCE A DAY FOR CHOLESTEROL 30 tablet 0   diltiazem (CARDIZEM) 30 MG tablet Take 2 tablets (60 mg total) by mouth 3 (three) times daily. 180 tablet 11   fluticasone (FLONASE) 50 MCG/ACT nasal spray Place  2 sprays into both nostrils daily. (Patient not taking: Reported on 09/09/2021)     tolterodine (DETROL LA) 4 MG 24 hr capsule Take 4 mg by mouth at bedtime. (Patient not taking: Reported on 09/09/2021)     No current facility-administered medications for this visit.    Allergies:   Patient has no known allergies.    ROS:  Please see the history of present illness.   Otherwise, review of systems are positive for none.   All other systems are reviewed and negative.    PHYSICAL EXAM: VS:  BP 110/83 (BP Location: Left Arm, Patient Position: Sitting, Cuff Size: Normal)    Pulse 92    Ht 5\' 11"  (1.803 m)    Wt 199 lb 3.2 oz (90.4 kg)    SpO2 96%    BMI 27.78 kg/m  , BMI Body mass index is 27.78 kg/m. GENERAL:  Well appearing NECK:  No jugular venous distention, waveform within normal limits, carotid upstroke brisk and symmetric, no bruits, no thyromegaly LUNGS:  Clear to  auscultation bilaterally CHEST:  Unremarkable HEART:  PMI not displaced or sustained,S1 and S2 within normal limits, no X3,GH clicks, no rubs, no murmurs, irregular ABD:  Flat, positive bowel sounds normal in frequency in pitch, no bruits, no rebound, no guarding, no midline pulsatile mass, no hepatomegaly, no splenomegaly EXT:  2 plus pulses throughout, no edema, no cyanosis no clubbing   EKG:  EKG is  ordered today. Atrial fibrillation, rate 82, axis, intervals within normal limits, no acute ST-T wave changes.  Recent Labs: 01/23/2021: BUN 21; Creatinine, Ser 1.18; Hemoglobin 17.6; Platelets 214; Potassium 4.4; Sodium 135    Lipid Panel    Component Value Date/Time   CHOL 130 10/18/2013 0816   TRIG 60.0 10/18/2013 0816   HDL 48.10 10/18/2013 0816   CHOLHDL 3 10/18/2013 0816   VLDL 12.0 10/18/2013 0816   LDLCALC 70 10/18/2013 0816      Wt Readings from Last 3 Encounters:  09/09/21 199 lb 3.2 oz (90.4 kg)  04/29/21 195 lb 9.6 oz (88.7 kg)  01/24/21 198 lb (89.8 kg)      Other studies Reviewed: Additional studies/ records that were reviewed today include:  Labs, EKG, home device Review of the above records demonstrates: See elsewhere   ASSESSMENT AND PLAN:  ATRIAL FIB:  [name] has a CHA2DS2 - VASc score of 1.   Cost has already been explored for Multaq and it was thought to be prohibitive.  He did not tolerate flecainide.  He and I talked about Tikosyn and he is reticent about being admitted for 3 days for this.  He would consider repeat ablation if he might come to this.  For now I am going to start him on Eliquis.  We can consider in the future whether he needs this long-term.  I am going to have him increase his Cardizem to 60 mg 3 times a day.  If he popped back in the sinus rhythm which I suspect he will I will probably switch to 120 mg long-acting.  I am going to have him set up to be seen in the electrophysiology clinic.  He will stop his olmesartan as his  blood pressure will be treated with the Cardizem.  I will check electrolytes today including magnesium.   HTN:   His blood pressure is going to be treated as above.   Current medicines are reviewed at length with the patient today.  The patient does not  have concerns regarding medicines.  The following changes have been made: As above  Labs/ tests ordered today include:     Orders Placed This Encounter  Procedures   Basic metabolic panel   Magnesium   EKG 12-Lead    Disposition:     I would like him to be seen in the Atrial Fib clinic.    Signed, [name], MD  09/09/2021 4:36 PM    [facility]

## 2021-09-09 NOTE — Telephone Encounter (Signed)
Patient c/o Palpitations:  High priority if patient c/o lightheadedness, shortness of breath, or chest pain  How long have you had palpitations/irregular HR/ Afib? Are you having the symptoms now? He feels like he has been in afib for "3 days now and not been able to get out of it"  Are you currently experiencing lightheadedness, SOB or CP? He stated he has had a couple of Dizzy spells   Do you have a history of afib (atrial fibrillation) or irregular heart rhythm? Yes   Have you checked your BP or HR? (document readings if available):  Hr range is 95 to 150 He has not taken his BP   Are you experiencing any other symptoms? He is just feeling tired and wore out. But no other symptoms   Best number [phone]

## 2021-09-09 NOTE — Telephone Encounter (Signed)
Spoke with pt, his Douglas Rodriguez reports he has been in a fib for about three days now. He has been taking only 30 mg of diltiazem and is now aware he can take 2 tablets every 8 hours. He is doing okay, just a little dizziness occasionally. He would like to see dr hochrein to discuss blood thinner because he seems to be having more atrial fib than in the past and thinks they need to discuss anticoag. Follow up scheduled this afternoon.

## 2021-09-10 LAB — BASIC METABOLIC PANEL
BUN/Creatinine Ratio: 18 (ref 10–24)
BUN: 21 mg/dL (ref 8–27)
CO2: 24 mmol/L (ref 20–29)
Calcium: 9.2 mg/dL (ref 8.6–10.2)
Chloride: 103 mmol/L (ref 96–106)
Creatinine, Ser: 1.18 mg/dL (ref 0.76–1.27)
Glucose: 89 mg/dL (ref 70–99)
Potassium: 5 mmol/L (ref 3.5–5.2)
Sodium: 140 mmol/L (ref 134–144)
eGFR: 67 mL/min/{1.73_m2} (ref >59)

## 2021-09-10 LAB — MAGNESIUM: Magnesium: 2.2 mg/dL (ref 1.6–2.3)

## 2021-09-12 ENCOUNTER — Telehealth: Payer: Self-pay | Admitting: Cardiology

## 2021-09-12 NOTE — Telephone Encounter (Signed)
Pt c/o medication issue:  1. Name of Medication: Diltiazem  2. How are you currently taking this medication (dosage and times per day)? 2 pills 3 times a day- his heart is now back in rythm t 3. Are you having a reaction (difficulty breathing--STAT)?   4. What is your medication issue? diarrhea and stomach cramps- wants to know if he can reduce the dose since he is back in rythm

## 2021-09-12 NOTE — Telephone Encounter (Signed)
Called patient, he was started on Cardizem 60 mg three times daily on 09/09/2021 office visit. He states that he has been back in rhythm since yesterday. HR today was 72,73. But he started having abdominal cramping and diarrhea this morning and he feels it is from the medication. He is requesting to change dosage or decrease to something else to see if symptoms get better.   Patient also states that he has a KardiaMobile device that shows he is in normal rhythm, but also at times it states unclassified. I asked if he could send readings and he is unsure of how to do this with his mychart account, advised I would make MD aware and would call with any recommendations.

## 2021-09-16 NOTE — Telephone Encounter (Signed)
Called patient, he states that the abdominal cramping and diarrhea has improved, he is still having issues with the AFIB, but states he has continued the Cardizem and it has been doing well. He would like to continue, see Malka So, PA on 01/17- and go from there.  Advised I would make MD aware.  Patient thankful for call back.

## 2021-09-23 ENCOUNTER — Ambulatory Visit (HOSPITAL_COMMUNITY)
Admission: RE | Admit: 2021-09-23 | Discharge: 2021-09-23 | Disposition: A | Discharge status: Home / Self Care | Payer: Medicare Other | Source: Ambulatory Visit | Attending: Physician Assistant | Admitting: Physician Assistant

## 2021-09-23 ENCOUNTER — Other Ambulatory Visit: Payer: Self-pay

## 2021-09-23 ENCOUNTER — Encounter (HOSPITAL_COMMUNITY): Payer: Self-pay | Admitting: Physician Assistant

## 2021-09-23 VITALS — BP 102/80 | HR 62 | Ht 71.0 in | Wt 200.0 lb

## 2021-09-23 DIAGNOSIS — I4892 Unspecified atrial flutter: Secondary | ICD-10-CM | POA: Insufficient documentation

## 2021-09-23 DIAGNOSIS — D6869 Other thrombophilia: Secondary | ICD-10-CM | POA: Insufficient documentation

## 2021-09-23 DIAGNOSIS — I10 Essential (primary) hypertension: Secondary | ICD-10-CM | POA: Diagnosis not present

## 2021-09-23 DIAGNOSIS — E785 Hyperlipidemia, unspecified: Secondary | ICD-10-CM | POA: Diagnosis not present

## 2021-09-23 DIAGNOSIS — I480 Paroxysmal atrial fibrillation: Secondary | ICD-10-CM | POA: Insufficient documentation

## 2021-09-23 DIAGNOSIS — Z79899 Other long term (current) drug therapy: Secondary | ICD-10-CM | POA: Diagnosis not present

## 2021-09-23 DIAGNOSIS — Z7901 Long term (current) use of anticoagulants: Secondary | ICD-10-CM | POA: Insufficient documentation

## 2021-09-23 MED ORDER — DILTIAZEM HCL ER COATED BEADS 120 MG PO CP24: 120.0000 mg | ORAL_CAPSULE | Freq: Every day | ORAL | 3 refills | Status: DC

## 2021-09-23 MED ORDER — DILTIAZEM HCL 30 MG PO TABS: ORAL_TABLET | ORAL | 11 refills | Status: DC

## 2021-09-23 NOTE — Patient Instructions (Signed)
° °  Start Cardizem 120mg  once a day   Change cardizem 30mg  tablets to only as needed for breakthrough afib (Cardizem 30mg  -- take 1 tablet every 4 hours AS NEEDED for heart rate >100 as long as top blood pressure >100.)

## 2021-09-23 NOTE — Progress Notes (Signed)
Primary Care Physician: [name]., MD Primary Cardiologist: Dr Percival Spanish  Primary Electrophysiologist: Dr [name] (remotely)  Referring Physician: Dr Hortense Ramal is a 70 y.o. male with a history of HLD, HTN, atrial flutter, atrial fibrillation who presents for consultation in the [facility].  The patient was diagnosed with atrial fibrillation remotely and had atrial fib ablation in 2012.  He had flutter with ablation in 2014. Patient is on Eliquis for a CHADS2VASC score of 2. He was seen by Dr Percival Spanish on 09/09/21 for increased tachypalpitations. He feels weak and lightheaded while in afib. His diltiazem was increased.   Today, patient feels the increased diltiazem has helped with the frequency of his symptoms. He brings in his [name] which shows some true episodes of afib and some SR with PACs. He denies any bleeding issues on anticoagulation.   Today, he denies symptoms of chest pain, shortness of breath, orthopnea, PND, lower extremity edema, dizziness, presyncope, syncope, snoring, daytime somnolence, bleeding, or neurologic sequela. The patient is tolerating medications without difficulties and is otherwise without complaint today.    Atrial Fibrillation Risk Factors:  he does not have symptoms or diagnosis of sleep apnea. he does not have a history of rheumatic fever.   he has a BMI of Body mass index is 27.89 kg/m.Marland Kitchen Filed Weights   09/23/21 1123  Weight: 90.7 kg    Family History  Problem Relation Age of Onset   Hypertension Neg Hx    Hyperlipidemia Neg Hx    Heart failure Neg Hx    Heart disease Neg Hx    Heart attack Neg Hx      Atrial Fibrillation Management history:  Previous antiarrhythmic drugs: flecainide (did not tolerate), Multaq (cost prohibitive)  Previous cardioversions: none Previous ablations: PVI 2012, CTI 2014 CHADS2VASC score: 2 Anticoagulation history: Eliquis   Past Medical History:   Diagnosis Date   Atrial fibrillation ([place])    s/p PVI by [facility] 4/12   Atrial flutter ([place])    s/p CTI ablation by Dr [name] 11/14   Basal cell carcinoma    BRADYCARDIA-TACHYCARDIA SYNDROME 10/23/2010   Qualifier: Diagnosis of  By: [name], MD, James     Hyperlipidemia    Long term (current) use of anticoagulants 06/07/2013   Eliquis    MRSA (methicillin resistant staph aureus) culture positive    colonization discovered 4/12   Palpitation 09/14/2013   PVC's (premature ventricular contractions)    Squamous carcinoma    Past Surgical History:  Procedure Laterality Date   ATRIAL FIBRILLATION ABLATION  12/11/10   PVI ablation by [place]  07/20/2013   CTI by Dr [name]   ATRIAL FLUTTER ABLATION N/A 07/20/2013   Procedure: ATRIAL FLUTTER ABLATION;  Surgeon: [name], MD;  Location: [place] CATH LAB;  Service: Cardiovascular;  Laterality: N/A;   BASAL CELL CARCINOMA EXCISION     "upper lip & back"   INGUINAL HERNIA REPAIR  [phone]   KNEE ARTHROSCOPY Left ~ 2009   SQUAMOUS CELL CARCINOMA EXCISION     "groin"   TONSILECTOMY, ADENOIDECTOMY, BILATERAL MYRINGOTOMY AND TUBES     TONSILLECTOMY      Current Outpatient Medications  Medication Sig Dispense Refill   acetaminophen (TYLENOL) 500 MG tablet Take 1,000 mg by mouth every 6 (six) hours as needed for pain.     apixaban (ELIQUIS) 5 MG TABS tablet Take 1 tablet (5 mg total) by mouth 2 (two)  times daily. 60 tablet 11   atorvastatin (LIPITOR) 10 MG tablet TAKE 1 TABLET BY MOUTH ONCE A DAY FOR CHOLESTEROL 30 tablet 0   diltiazem (CARDIZEM) 30 MG tablet Take 2 tablets (60 mg total) by mouth 3 (three) times daily. 180 tablet 11   fluticasone (FLONASE) 50 MCG/ACT nasal spray Place 2 sprays into both nostrils daily.     Multiple Vitamins-Minerals (CENTRUM SILVER 50+MEN) TABS Take by mouth.     tolterodine (DETROL LA) 4 MG 24 hr capsule Take 4 mg by mouth at bedtime.     No current facility-administered medications for this  encounter.    No Known Allergies  Social History   Socioeconomic History   Marital status: Married    Spouse name: susan   Number of children: 2   Years of education: college   Highest education level: Not on file  Occupational History   Occupation: [employer]  Tobacco Use   Smoking status: Never   Smokeless tobacco: Never  Substance and Sexual Activity   Alcohol use: Not Currently    Alcohol/week: 1.0 standard drink    Types: 1 Cans of beer per week    Comment: no alcohol in a month 09/23/21   Drug use: No   Sexual activity: Yes  Other Topics Concern   Not on file  Social History Narrative   He is married and lives in [place].  He has two children, no grandchildren.     He works in Press photographer.   Social Determinants of Health   Financial Resource Strain: Not on file  Food Insecurity: Not on file  Transportation Needs: Not on file  Physical Activity: Not on file  Stress: Not on file  Social Connections: Not on file  Intimate Partner Violence: Not on file     ROS- All systems are reviewed and negative except as per the HPI above.  Physical Exam: Vitals:   09/23/21 1123  BP: 102/80  Pulse: 62  Weight: 90.7 kg  Height: 5\' 11"  (1.803 m)    GEN- The patient is a well appearing male, alert and oriented x 3 today.   Head- normocephalic, atraumatic Eyes-  Sclera clear, conjunctiva pink Ears- hearing intact Oropharynx- clear Neck- supple  Lungs- Clear to ausculation bilaterally, normal work of breathing Heart- Regular rate and rhythm, occasional ectopic beat, no murmurs, rubs or gallops  GI- soft, NT, ND, + BS Extremities- no clubbing, cyanosis, or edema MS- no significant deformity or atrophy Skin- no rash or lesion Psych- euthymic mood, full affect Neuro- strength and sensation are intact  Wt Readings from Last 3 Encounters:  09/23/21 90.7 kg  09/09/21 90.4 kg  04/29/21 88.7 kg    EKG today demonstrates  SR, PACs Vent. rate 62 BPM PR interval  188 ms QRS duration 86 ms QT/QTcB 414/420 ms  Epic records are reviewed at length today  CHA2DS2-VASc Score = 2  The patient's score is based upon: CHF History: 0 HTN History: 1 Diabetes History: 0 Stroke History: 0 Vascular Disease History: 0 Age Score: 1 Gender Score: 0       ASSESSMENT AND PLAN: 1. Paroxysmal Atrial Fibrillation/atrial flutter The patient's CHA2DS2-VASc score is 2, indicating a 2.2% annual risk of stroke.   S/p afib ablation 2012 and atrial flutter ablation 2014 Patient having more frequent palpitations, improved with diltiazem. We discussed options for rhythm control AAD vs ablation. He is interested in repeat ablation, will refer to EP.   Check echocardiogram  Start diltiazem 120 mg  daily with 30 mg PRN q 4 hours for heart racing. Continue Eliquis 5 mg BID Kardia for home monitoring.   2. Secondary Hypercoagulable State (ICD10:  D68.69) The patient is at significant risk for stroke/thromboembolism based upon his CHA2DS2-VASc Score of 2.  Continue Apixaban (Eliquis).   3. HTN Stable, med changes as above.    Follow up with EP for ablation consideration.    [facility] [address] [place], [place] [address] [phone] 09/23/2021 11:41 AM

## 2021-10-20 NOTE — Progress Notes (Unsigned)
Electrophysiology Office Note:    Date:  10/20/2021   ID:  Douglas Rodriguez, DOB Sep 12, 1950, MRN [id number]  PCP:  [name]., MD  [facility] Cardiologist:  [name], MD  [place] Electrophysiologist:  None   Referring MD: Oliver Barre, PA   Chief Complaint: Atrial fibrillation  History of Present Illness:    Douglas Rodriguez is a 71 y.o. male who presents for an evaluation of atrial fibrillation at the request of Douglas Peals, PA-C.  The patient last saw Douglas Rodriguez on September 23, 2021 in the atrial fibrillation clinic.  The patient also carries a diagnosis of hypertension, hyperlipidemia and atrial flutter.  He has a CHA2DS2-VASc of 2 and takes Eliquis for stroke prophylaxis.  When he is out of rhythm, he feels weak and lightheaded.  Cardia strips were reviewed by [facility] in clinic which showed true episodes of atrial fibrillation.  He had a prior ablation in 2012 for atrial fibrillation and flutter in 2014.  He is interested in repeat ablation.    Past Medical History:  Diagnosis Date   Atrial fibrillation ([place])    s/p PVI by [facility] 4/12   Atrial flutter [facility])    s/p CTI ablation by Dr [name] 11/14   Basal cell carcinoma    BRADYCARDIA-TACHYCARDIA SYNDROME 10/23/2010   Qualifier: Diagnosis of  By: [name], MD, James     Hyperlipidemia    Long term (current) use of anticoagulants 06/07/2013   Eliquis    MRSA (methicillin resistant staph aureus) culture positive    colonization discovered 4/12   Palpitation 09/14/2013   PVC's (premature ventricular contractions)    Squamous carcinoma     Past Surgical History:  Procedure Laterality Date   ATRIAL FIBRILLATION ABLATION  12/11/10   PVI ablation by [place]  07/20/2013   CTI by Dr [name]   ATRIAL FLUTTER ABLATION N/A 07/20/2013   Procedure: ATRIAL FLUTTER ABLATION;  Surgeon: [name], MD;  Location: [place] CATH LAB;  Service: Cardiovascular;  Laterality: N/A;   BASAL CELL CARCINOMA  EXCISION     "upper lip & back"   INGUINAL HERNIA REPAIR  [phone]   KNEE ARTHROSCOPY Left ~ 2009   SQUAMOUS CELL CARCINOMA EXCISION     "groin"   TONSILECTOMY, ADENOIDECTOMY, BILATERAL MYRINGOTOMY AND TUBES     TONSILLECTOMY      Current Medications: No outpatient medications have been marked as taking for the 10/21/21 encounter (Appointment) with Vickie Epley, MD.     Allergies:   Patient has no known allergies.   Social History   Socioeconomic History   Marital status: Married    Spouse name: susan   Number of children: 2   Years of education: college   Highest education level: Not on file  Occupational History   Occupation: [employer]  Tobacco Use   Smoking status: Never   Smokeless tobacco: Never  Substance and Sexual Activity   Alcohol use: Not Currently    Alcohol/week: 1.0 standard drink    Types: 1 Cans of beer per week    Comment: no alcohol in a month 09/23/21   Drug use: No   Sexual activity: Yes  Other Topics Concern   Not on file  Social History Narrative   He is married and lives in [place].  He has two children, no grandchildren.     He works in Press photographer.   Social Determinants of Health   Financial Resource Strain: Not on file  Food Insecurity: Not on file  Transportation Needs: Not on file  Physical Activity: Not on file  Stress: Not on file  Social Connections: Not on file     Family History: The patient's family history is negative for Hypertension, Hyperlipidemia, Heart failure, Heart disease, and Heart attack.  ROS:   Please see the history of present illness.    All other systems reviewed and are negative.  EKGs/Labs/Other Studies Reviewed:    The following studies were reviewed today:  2012 PVI ablation record reviewed During the ablation the pulmonary veins were isolated.  Complex fractionated atrial electrograms were also targeted on the roof, septum and base of the left atrial appendage.  Ablation was also  performed along the coronary sinus within the left atrium.  2014 ablation record reviewed CTI ablated  EKG:  The ekg ordered today demonstrates ***   Recent Labs: 01/23/2021: Hemoglobin 17.6; Platelets 214 09/09/2021: BUN 21; Creatinine, Ser 1.18; Magnesium 2.2; Potassium 5.0; Sodium 140  Recent Lipid Panel    Component Value Date/Time   CHOL 130 10/18/2013 0816   TRIG 60.0 10/18/2013 0816   HDL 48.10 10/18/2013 0816   CHOLHDL 3 10/18/2013 0816   VLDL 12.0 10/18/2013 0816   LDLCALC 70 10/18/2013 0816    Physical Exam:    VS:  There were no vitals taken for this visit.    Wt Readings from Last 3 Encounters:  09/23/21 200 lb (90.7 kg)  09/09/21 199 lb 3.2 oz (90.4 kg)  04/29/21 195 lb 9.6 oz (88.7 kg)     GEN: *** Well nourished, well developed in no acute distress HEENT: Normal NECK: No JVD; No carotid bruits LYMPHATICS: No lymphadenopathy CARDIAC: ***RRR, no murmurs, rubs, gallops RESPIRATORY:  Clear to auscultation without rales, wheezing or rhonchi  ABDOMEN: Soft, non-tender, non-distended MUSCULOSKELETAL:  No edema; No deformity  SKIN: Warm and dry NEUROLOGIC:  Alert and oriented x 3 PSYCHIATRIC:  Normal affect       ASSESSMENT:    No diagnosis found. PLAN:    In order of problems listed above:   Redo ablation CT      Total time spent with patient today *** minutes. This includes reviewing records, evaluating the patient and coordinating care.  Medication Adjustments/Labs and Tests Ordered: Current medicines are reviewed at length with the patient today.  Concerns regarding medicines are outlined above.  No orders of the defined types were placed in this encounter.  No orders of the defined types were placed in this encounter.    Signed, Hilton Cork. Quentin Ore, MD, [facility], [facility] 10/20/2021 11:56 AM    Electrophysiology [facility]

## 2021-10-21 ENCOUNTER — Encounter: Payer: Self-pay | Admitting: Cardiology

## 2021-10-21 ENCOUNTER — Other Ambulatory Visit: Payer: Self-pay

## 2021-10-21 ENCOUNTER — Ambulatory Visit: Payer: Medicare Other | Admitting: Cardiology

## 2021-10-21 VITALS — BP 114/64 | HR 90 | Ht 71.0 in | Wt 200.6 lb

## 2021-10-21 DIAGNOSIS — I10 Essential (primary) hypertension: Secondary | ICD-10-CM | POA: Diagnosis not present

## 2021-10-21 DIAGNOSIS — I4819 Other persistent atrial fibrillation: Secondary | ICD-10-CM

## 2021-10-21 NOTE — Patient Instructions (Addendum)
Medication Instructions:  Your physician recommends that you continue on your current medications as directed. Please refer to the Current Medication list given to you today. *If you need a refill on your cardiac medications before your next appointment, please call your pharmacy*  Lab Work: None. If you have labs (blood work) drawn today and your tests are completely normal, you will receive your results only by: Gross (if you have MyChart) OR A paper copy in the mail If you have any lab test that is abnormal or we need to change your treatment, we will call you to review the results.  Testing/Procedures: Your physician has requested that you have cardiac CT. Cardiac computed tomography (CT) is a painless test that uses an x-ray machine to take clear, detailed pictures of your heart. For further information please visit [email or web address]. Please follow instruction sheet as given.  Your physician has recommended that you have an ablation. Catheter ablation is a medical procedure used to treat some cardiac arrhythmias (irregular heartbeats). During catheter ablation, a long, thin, flexible tube is put into a blood vessel in your groin (upper thigh), or neck. This tube is called an ablation catheter. It is then guided to your heart through the blood vessel. Radio frequency waves destroy small areas of heart tissue where abnormal heartbeats may cause an arrhythmia to start. Please see the instruction sheet given to you today.   Follow-Up: At [facility], you and your health needs are our priority.  As part of our continuing mission to provide you with exceptional heart care, we have created designated Provider Care Teams.  These Care Teams include your primary Cardiologist (physician) and Advanced Practice Providers (APPs -  Physician Assistants and Nurse Practitioners) who all work together to provide you with the care you need, when you need it.  Your physician wants you to  follow-up in: Ablation days to pick from at this time are May 8,12,15,22. Please call Douglas Rodriguez, Douglas Rodriguez, Douglas Rodriguez's nurse when you have picked and date so we can make sure it is still available. [phone] or mychart message.   We recommend signing up for the patient portal called "MyChart".  Sign up information is provided on this After Visit Summary.  MyChart is used to connect with patients for Virtual Visits (Telemedicine).  Patients are able to view lab/test results, encounter notes, upcoming appointments, etc.  Non-urgent messages can be sent to your provider as well.   To learn more about what you can do with MyChart, go to [email or web address].    Any Other Special Instructions Will Be Listed Below (If Applicable).  Cardiac Ablation Cardiac ablation is a procedure to destroy (ablate) some heart tissue that is sending bad signals. These bad signals cause problems in heart rhythm. The heart has many areas that make these signals. If there are problems in these areas, they can make the heart beat in a way that is not normal. Destroying some tissues can help make the heart rhythm normal. Tell your doctor about: Any allergies you have. All medicines you are taking. These include vitamins, herbs, eye drops, creams, and over-the-counter medicines. Any problems you or family members have had with medicines that make you fall asleep (anesthetics). Any blood disorders you have. Any surgeries you have had. Any medical conditions you have, such as kidney failure. Whether you are pregnant or may be pregnant. What are the risks? This is a safe procedure. But problems may occur, including: Infection. Bruising and bleeding. Bleeding into the chest. Stroke or blood  clots. Damage to nearby areas of your body. Allergies to medicines or dyes. The need for a pacemaker if the normal system is damaged. Failure of the procedure to treat the problem. What happens before the procedure? Medicines Ask your  doctor about: Changing or stopping your normal medicines. This is important. Taking aspirin and ibuprofen. Do not take these medicines unless your doctor tells you to take them. Taking other medicines, vitamins, herbs, and supplements. General instructions Follow instructions from your doctor about what you cannot eat or drink. Plan to have someone take you home from the hospital or clinic. If you will be going home right after the procedure, plan to have someone with you for 24 hours. Ask your doctor what steps will be taken to prevent infection. What happens during the procedure?  An IV tube will be put into one of your veins. You will be given a medicine to help you relax. The skin on your neck or groin will be numbed. A cut (incision) will be made in your neck or groin. A needle will be put through your cut and into a large vein. A tube (catheter) will be put into the needle. The tube will be moved to your heart. Dye may be put through the tube. This helps your doctor see your heart. Small devices (electrodes) on the tube will send out signals. A type of energy will be used to destroy some heart tissue. The tube will be taken out. Pressure will be held on your cut. This helps stop bleeding. A bandage will be put over your cut. The exact procedure may vary among doctors and hospitals. What happens after the procedure? You will be watched until you leave the hospital or clinic. This includes checking your heart rate, breathing rate, oxygen, and blood pressure. Your cut will be watched for bleeding. You will need to lie still for a few hours. Do not drive for 24 hours or as long as your doctor tells you. Summary Cardiac ablation is a procedure to destroy some heart tissue. This is done to treat heart rhythm problems. Tell your doctor about any medical conditions you may have. Tell him or her about all medicines you are taking to treat them. This is a safe procedure. But problems may  occur. These include infection, bruising, bleeding, and damage to nearby areas of your body. Follow what your doctor tells you about food and drink. You may also be told to change or stop some of your medicines. After the procedure, do not drive for 24 hours or as long as your doctor tells you. This information is not intended to replace advice given to you by your health care provider. Make sure you discuss any questions you have with your health care provider. Document Revised: 07/27/2019 Document Reviewed: 07/27/2019 Elsevier Patient Education  2022 [employer].

## 2021-10-21 NOTE — Progress Notes (Signed)
Electrophysiology Office Note:    Date:  10/21/2021   ID:  Douglas Rodriguez, DOB [birth year], MRN [id number]  PCP:  [name]., MD  [facility] Cardiologist:  [name], MD  [facility] Electrophysiologist:  Vickie Epley, MD   Referring MD: Oliver Barre, PA   Chief Complaint: Atrial fibrillation  History of Present Illness:    Douglas Rodriguez is a 71 y.o. male who presents for an evaluation of atrial fibrillation at the request of [name], PA-C.  The patient last saw [name] on September 23, 2021 in the atrial fibrillation clinic.  The patient also carries a diagnosis of hypertension, hyperlipidemia and atrial flutter.  He has a CHA2DS2-VASc of 2 and takes Eliquis for stroke prophylaxis.  When he is out of rhythm, he feels weak and lightheaded.  Cardia strips were reviewed by [facility] in clinic which showed true episodes of atrial fibrillation.  He had a prior ablation in 2012 for atrial fibrillation and flutter in 2014.  He is interested in repeat ablation.  Overall, he appears well. In the interim since his ablation in 2014 he went years without any recurring atrial fibrillation. However, in the past 6-8 months he has felt it gradually returning. Usually he would take tylenol and rest, which would resolve his Afib by the next morning. Lately it feels like he is in Afib constantly. If his heart rate is under 110 bpm he only feels fatigued and a "little woozy." He has been monitoring his Afib with his Kardia mobile.  He was started on Eliquis a month ago, no bleeding issues. Endorses bruising easily.   He denies any chest pain, shortness of breath, or peripheral edema. No headaches, syncope, orthopnea, or PND.  Of note, he has a Mohs' procedure scheduled in a couple months.    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    colonization discovered 4/12   Palpitation 09/14/2013   PVC's (premature ventricular contractions)    Squamous carcinoma     Past Surgical History:  Procedure Laterality Date   ATRIAL FIBRILLATION ABLATION  12/11/10   PVI ablation by [place]  07/20/2013   CTI by Dr [name]   ATRIAL FLUTTER ABLATION N/A 07/20/2013   Procedure: ATRIAL FLUTTER ABLATION;  Surgeon: [name], MD;  Location: [facility] CATH [facility];  Service: Cardiovascular;  Laterality: N/A;   BASAL CELL CARCINOMA EXCISION     "upper lip & back"   INGUINAL HERNIA REPAIR  [phone]   KNEE ARTHROSCOPY Left ~ 2009   SQUAMOUS CELL CARCINOMA EXCISION     "groin"   TONSILECTOMY, ADENOIDECTOMY, BILATERAL MYRINGOTOMY AND TUBES     TONSILLECTOMY      Current Medications: Current Meds  Medication Sig   acetaminophen (TYLENOL) 500 MG tablet Take 1,000 mg by mouth every 6 (six) hours as needed for pain.   apixaban (ELIQUIS) 5 MG TABS tablet Take 1 tablet (5 mg total) by mouth 2 (two) times daily.   atorvastatin (LIPITOR) 10 MG tablet TAKE 1 TABLET  BY MOUTH ONCE A DAY FOR CHOLESTEROL   diltiazem (CARDIZEM CD) 120 MG 24 hr capsule Take 1 capsule (120 mg total) by mouth daily.   diltiazem (CARDIZEM) 30 MG tablet Take 1 tablet every 4 hours AS NEEDED for heart rate >100 as long as top BP >100.   fluticasone (FLONASE) 50 MCG/ACT nasal spray Place 2 sprays into both nostrils daily.   Multiple Vitamins-Minerals (CENTRUM SILVER 50+MEN) TABS Take by mouth.   OLMESARTAN MEDOXOMIL PO    tolterodine (DETROL LA) 4 MG 24 hr capsule Take 4 mg by mouth at bedtime.     Allergies:   Patient has no known allergies.   Social History   Socioeconomic History   Marital status: Married    Spouse  name: susan   Number of children: 2   Years of education: college   Highest education level: Not on file  Occupational History   Occupation: [employer]  Tobacco Use   Smoking status: Never   Smokeless tobacco: Never  Substance and Sexual Activity   Alcohol use: Not Currently    Alcohol/week: 1.0 standard drink    Types: 1 Cans of beer per week    Comment: no alcohol in a month 09/23/21   Drug use: No   Sexual activity: Yes  Other Topics Concern   Not on file  Social History Narrative   He is married and lives in [place].  He has two children, no grandchildren.     He works in [employer].   Social Determinants of Health   Financial Resource Strain: Not on file  Food Insecurity: Not on file  Transportation Needs: Not on file  Physical Activity: Not on file  Stress: Not on file  Social Connections: Not on file     Family History: The patient's family history is negative for Hypertension, Hyperlipidemia, Heart failure, Heart disease, and Heart attack.  ROS:   Please see the history of present illness.    (+) Palpitations (+) Fatigue (+) Lightheadedness (+) Easy bruising All other systems reviewed and are negative.  EKGs/Labs/Other Studies Reviewed:    The following studies were reviewed today:  2012 PVI ablation record reviewed During the ablation the pulmonary veins were isolated.  Complex fractionated atrial electrograms were also targeted on the roof, septum and base of the left atrial appendage.  Ablation was also performed along the coronary sinus within the left atrium.  2014 ablation record reviewed CTI ablated  EKG:  EKG is personally reviewed. 10/21/2021: Coarse atrial fibrillation  Recent Labs: 01/23/2021: Hemoglobin 17.6; Platelets 214 09/09/2021: BUN 21; Creatinine, Ser 1.18; Magnesium 2.2; Potassium 5.0; Sodium 140   Recent Lipid Panel    Component Value Date/Time   CHOL 130 10/18/2013 0816   TRIG 60.0 10/18/2013 0816   HDL 48.10 10/18/2013  0816   CHOLHDL 3 10/18/2013 0816   VLDL 12.0 10/18/2013 0816   LDLCALC 70 10/18/2013 0816    Physical Exam:    VS:  BP 114/64    Pulse 90    Ht 5\' 11"  (1.803 m)    Wt 200 lb 9.6 oz (91 kg)    SpO2 98%    BMI 27.98 kg/m     Wt Readings from Last 3 Encounters:  10/21/21 200 lb 9.6 oz (91 kg)  09/23/21 200 lb (90.7 kg)  09/09/21 199 lb 3.2 oz (90.4 kg)     GEN: Well nourished, well developed in no acute distress HEENT: Normal NECK: No JVD; No carotid bruits LYMPHATICS: No lymphadenopathy  CARDIAC: Irregularly irregular, no murmurs, rubs, gallops RESPIRATORY:  Clear to auscultation without rales, wheezing or rhonchi  ABDOMEN: Soft, non-tender, non-distended MUSCULOSKELETAL:  No edema; No deformity  SKIN: Warm and dry NEUROLOGIC:  Alert and oriented x 3 PSYCHIATRIC:  Normal affect       ASSESSMENT:    1. Persistent atrial fibrillation ([place])   2. Essential hypertension    PLAN:    In order of problems listed above:  #Persistent atrial fibrillation Symptomatic with fatigue.  Rhythm control is indicated.  I discussed the treatment options available to him including antiarrhythmic drugs and catheter ablation.  We discussed the risks, benefits and recovery time after an ablation procedure.  We discussed success rates between ablation and antiarrhythmic drugs.  If we are to pursue antiarrhythmic drug, would plan for dofetilide.  I discussed the loading process for dofetilide in detail.  He would like to talk over his options with his wife but is leaning towards a redo ablation attempt first.  We discussed the possibility that after ablation procedure he would require antiarrhythmic drug.  Risk, benefits, and alternatives to EP study and radiofrequency ablation for afib were also discussed in detail today. These risks include but are not limited to stroke, bleeding, vascular damage, tamponade, perforation, damage to the esophagus, lungs, and other structures, pulmonary vein stenosis,  worsening renal function, and death. We will therefore proceed with catheter ablation at the next available time.  Carto, ICE, anesthesia are requested for the procedure.  Will also obtain CT PV protocol prior to the procedure to exclude LAA thrombus and further evaluate atrial anatomy.  #Hypertension Controlled  Total time spent with patient today 45 minutes. This includes reviewing records, evaluating the patient and coordinating care.  Medication Adjustments/Labs and Tests Ordered: Current medicines are reviewed at length with the patient today.  Concerns regarding medicines are outlined above.   No orders of the defined types were placed in this encounter.  No orders of the defined types were placed in this encounter.  I,Mathew Stumpf,acting as a Education administrator for Vickie Epley, MD.,have documented all relevant documentation on the behalf of Vickie Epley, MD,as directed by  Vickie Epley, MD while in the presence of Vickie Epley, MD.  I, Vickie Epley, MD, have reviewed all documentation for this visit. The documentation on 10/21/21 for the exam, diagnosis, procedures, and orders are all accurate and complete.   Signed, Hilton Cork. Quentin Ore, MD, [facility], [facility] 10/21/2021 9:15 AM    Electrophysiology [facility]

## 2021-10-27 ENCOUNTER — Telehealth: Payer: Self-pay | Admitting: Cardiology

## 2021-10-27 NOTE — Telephone Encounter (Signed)
Patient states he found out Dr. [name] will still be working part time. He would like to know if he could schedule his ablation with Dr. [name] since he has done his previous ablations.

## 2021-10-29 ENCOUNTER — Telehealth: Payer: Self-pay | Admitting: Cardiology

## 2021-10-29 NOTE — Telephone Encounter (Signed)
See open message regarding this subject on 10/27/2021.  Closing as duplicate encounter.

## 2021-10-29 NOTE — Telephone Encounter (Signed)
Sent mychart message to Pt advising need to discuss with JA.

## 2021-10-29 NOTE — Telephone Encounter (Signed)
New Message:      Patient says he is supposed to be getting an Ablation. He said if possible, he would like for Dr. [name] to do it please.

## 2021-10-30 NOTE — Telephone Encounter (Signed)
Discussed with Dr. [name].  Ok to ablate per Dr. [name].  Surgical date 12/26/21 held for Pt.  Will schedule f/u with JA to discuss.  Will notify Dr. Mardene Speak nurse.

## 2021-11-04 ENCOUNTER — Other Ambulatory Visit: Payer: Self-pay

## 2021-11-04 ENCOUNTER — Ambulatory Visit (HOSPITAL_COMMUNITY)
Admission: RE | Admit: 2021-11-04 | Discharge: 2021-11-04 | Disposition: A | Discharge status: Home / Self Care | Payer: Medicare Other | Source: Ambulatory Visit | Attending: Physician Assistant | Admitting: Physician Assistant

## 2021-11-04 DIAGNOSIS — I4892 Unspecified atrial flutter: Secondary | ICD-10-CM | POA: Insufficient documentation

## 2021-11-04 DIAGNOSIS — I081 Rheumatic disorders of both mitral and tricuspid valves: Secondary | ICD-10-CM | POA: Insufficient documentation

## 2021-11-04 DIAGNOSIS — I480 Paroxysmal atrial fibrillation: Secondary | ICD-10-CM | POA: Diagnosis present

## 2021-11-04 DIAGNOSIS — E785 Hyperlipidemia, unspecified: Secondary | ICD-10-CM | POA: Diagnosis not present

## 2021-11-04 LAB — ECHOCARDIOGRAM COMPLETE
Calc EF: 60 %
S' Lateral: 2.5 cm
Single Plane A2C EF: 54.2 %
Single Plane A4C EF: 65.5 %

## 2021-11-04 NOTE — Progress Notes (Signed)
°  Echocardiogram 2D Echocardiogram has been performed.  Fidel Levy 11/04/2021, 11:34 AM

## 2021-11-13 ENCOUNTER — Other Ambulatory Visit: Payer: Self-pay

## 2021-11-13 ENCOUNTER — Ambulatory Visit (HOSPITAL_BASED_OUTPATIENT_CLINIC_OR_DEPARTMENT_OTHER): Payer: Medicare Other | Admitting: Internal Medicine

## 2021-11-13 ENCOUNTER — Encounter (HOSPITAL_BASED_OUTPATIENT_CLINIC_OR_DEPARTMENT_OTHER): Payer: Self-pay | Admitting: Internal Medicine

## 2021-11-13 VITALS — BP 124/76 | HR 95 | Ht 71.0 in | Wt 201.0 lb

## 2021-11-13 DIAGNOSIS — I480 Paroxysmal atrial fibrillation: Secondary | ICD-10-CM | POA: Diagnosis not present

## 2021-11-13 DIAGNOSIS — D6869 Other thrombophilia: Secondary | ICD-10-CM

## 2021-11-13 DIAGNOSIS — I10 Essential (primary) hypertension: Secondary | ICD-10-CM

## 2021-11-13 DIAGNOSIS — I4891 Unspecified atrial fibrillation: Secondary | ICD-10-CM | POA: Diagnosis not present

## 2021-11-13 MED ORDER — METOPROLOL TARTRATE 50 MG PO TABS: ORAL_TABLET | ORAL | 0 refills | Status: DC

## 2021-11-13 NOTE — Patient Instructions (Addendum)
Medication Instructions:  ?Your physician recommends that you continue on your current medications as directed. Please refer to the Current Medication list given to you today. ? ?Labwork: ?None ordered. ? ?Testing/Procedures: ?Your physician has requested that you have cardiac CT. Cardiac computed tomography (CT) is a painless test that uses an x-ray machine to take clear, detailed pictures of your heart.   ? ?Follow-Up: ? ?SEE INSTRUCTION LETTER ? ?Any Other Special Instructions Will Be Listed Below (If Applicable). ? ?If you need a refill on your cardiac medications before your next appointment, please call your pharmacy.  ? ?Cardiac Ablation ?Cardiac ablation is a procedure to destroy, or ablate, a small amount of heart tissue in very specific places. The heart has many electrical connections. Sometimes these connections are abnormal and can cause the heart to beat very fast or irregularly. Ablating some of the areas that cause problems can improve the heart's rhythm or return it to normal. Ablation may be done for people who: ?Have Wolff-Parkinson-White syndrome. ?Have fast heart rhythms (tachycardia). ?Have taken medicines for an abnormal heart rhythm (arrhythmia) that were not effective or caused side effects. ?Have a high-risk heartbeat that may be life-threatening. ?During the procedure, a small incision is made in the neck or the groin, and a long, thin tube (catheter) is inserted into the incision and moved to the heart. Small devices (electrodes) on the tip of the catheter will send out electrical currents. A type of X-ray (fluoroscopy) will be used to help guide the catheter and to provide images of the heart. ?Tell a health care provider about: ?Any allergies you have. ?All medicines you are taking, including vitamins, herbs, eye drops, creams, and over-the-counter medicines. ?Any problems you or family members have had with anesthetic medicines. ?Any blood disorders you have. ?Any surgeries you have  had. ?Any medical conditions you have, such as kidney failure. ?Whether you are pregnant or may be pregnant. ?What are the risks? ?Generally, this is a safe procedure. However, problems may occur, including: ?Infection. ?Bruising and bleeding at the catheter insertion site. ?Bleeding into the chest, especially into the sac that surrounds the heart. This is a serious complication. ?Stroke or blood clots. ?Damage to nearby structures or organs. ?Allergic reaction to medicines or dyes. ?Need for a permanent pacemaker if the normal electrical system is damaged. A pacemaker is a small computer that sends electrical signals to the heart and helps your heart beat normally. ?The procedure not being fully effective. This may not be recognized until months later. Repeat ablation procedures are sometimes done. ?What happens before the procedure? ?Medicines ?Ask your health care provider about: ?Changing or stopping your regular medicines. This is especially important if you are taking diabetes medicines or blood thinners. ?Taking medicines such as aspirin and ibuprofen. These medicines can thin your blood. Do not take these medicines unless your health care provider tells you to take them. ?Taking over-the-counter medicines, vitamins, herbs, and supplements. ?General instructions ?Follow instructions from your health care provider about eating or drinking restrictions. ?Plan to have someone take you home from the hospital or clinic. ?If you will be going home right after the procedure, plan to have someone with you for 24 hours. ?Ask your health care provider what steps will be taken to prevent infection. ?What happens during the procedure? ? ?An IV will be inserted into one of your veins. ?You will be given a medicine to help you relax (sedative). ?The skin on your neck or groin will be numbed. ?An incision  will be made in your neck or your groin. ?A needle will be inserted through the incision and into a large vein in your  neck or groin. ?A catheter will be inserted into the needle and moved to your heart. ?Dye may be injected through the catheter to help your surgeon see the area of the heart that needs treatment. ?Electrical currents will be sent from the catheter to ablate heart tissue in desired areas. There are three types of energy that may be used to do this: ?Heat (radiofrequency energy). ?Laser energy. ?Extreme cold (cryoablation). ?When the tissue has been ablated, the catheter will be removed. ?Pressure will be held on the insertion area to prevent a lot of bleeding. ?A bandage (dressing) will be placed over the insertion area. ?The exact procedure may vary among health care providers and hospitals. ?What happens after the procedure? ?Your blood pressure, heart rate, breathing rate, and blood oxygen level will be monitored until you leave the hospital or clinic. ?Your insertion area will be monitored for bleeding. You will need to lie still for a few hours to ensure that you do not bleed from the insertion area. ?Do not drive for 24 hours or as long as told by your health care provider. ?Summary ?Cardiac ablation is a procedure to destroy, or ablate, a small amount of heart tissue using an electrical current. This procedure can improve the heart rhythm or return it to normal. ?Tell your health care provider about any medical conditions you may have and all medicines you are taking to treat them. ?This is a safe procedure, but problems may occur. Problems may include infection, bruising, damage to nearby organs or structures, or allergic reactions to medicines. ?Follow your health care provider's instructions about eating and drinking before the procedure. You may also be told to change or stop some of your medicines. ?After the procedure, do not drive for 24 hours or as long as told by your health care provider. ?This information is not intended to replace advice given to you by your health care provider. Make sure you  discuss any questions you have with your health care provider. ?Document Revised: 07/03/2019 Document Reviewed: 07/03/2019 ?Elsevier Patient Education ? 2022 [employer]. ? ? ? ? ?

## 2021-11-13 NOTE — Progress Notes (Signed)
? ? ?PCP: [name]., MD ?Primary Cardiologist: Dr Percival Spanish ?Primary EP: Dr [name] ? ?Douglas Rodriguez is a 71 y.o. male who presents today for routine electrophysiology followup.  He was recently seen by Dr Quentin Ore for recurrent afib (his note reviewed).  The patient is s/p PVI by me in 2012 and CTI ablation 2014. He did very well thereafter.  Unfortunately, his afib has recently returned over the past year.  + palpitations and fatigue.  AF has been confirmed by [employer] strips. Today, he denies symptoms of palpitations, chest pain, shortness of breath,  lower extremity edema, dizziness, presyncope, or syncope.  The patient is otherwise without complaint today.  ? ?Past Medical History:  ?Diagnosis Date  ? Atrial fibrillation ([place])   ? s/p PVI by JA 4/12  ? Atrial flutter [facility])   ? s/p CTI ablation by Dr [name] 11/14  ? Basal cell carcinoma   ? BRADYCARDIA-TACHYCARDIA SYNDROME 10/23/2010  ? Qualifier: Diagnosis of  By: [name], MD, [name]    ? Hyperlipidemia   ? Long term (current) use of anticoagulants 06/07/2013  ? Eliquis   ? MRSA (methicillin resistant staph aureus) culture positive   ? colonization discovered 4/12  ? Palpitation 09/14/2013  ? PVC's (premature ventricular contractions)   ? Squamous carcinoma   ? ?Past Surgical History:  ?Procedure Laterality Date  ? ATRIAL FIBRILLATION ABLATION  12/11/10  ? PVI ablation by JA  ? ATRIAL FLUTTER ABLATION  07/20/2013  ? CTI by Dr [name]  ? ATRIAL FLUTTER ABLATION N/A 07/20/2013  ? Procedure: ATRIAL FLUTTER ABLATION;  Surgeon: [name], MD;  Location: [facility] CATH [facility];  Service: Cardiovascular;  Laterality: N/A;  ? BASAL CELL CARCINOMA EXCISION    ? "upper lip & back"  ? INGUINAL HERNIA REPAIR  [phone]  ? KNEE ARTHROSCOPY Left ~ 2009  ? SQUAMOUS CELL CARCINOMA EXCISION    ? "groin"  ? TONSILECTOMY, ADENOIDECTOMY, BILATERAL MYRINGOTOMY AND TUBES    ? TONSILLECTOMY    ? ? ?ROS- all systems are reviewed and negatives except as per HPI above ? ?Current  Outpatient Medications  ?Medication Sig Dispense Refill  ? acetaminophen (TYLENOL) 500 MG tablet Take 1,000 mg by mouth every 6 (six) hours as needed for pain.    ? apixaban (ELIQUIS) 5 MG TABS tablet Take 1 tablet (5 mg total) by mouth 2 (two) times daily. 60 tablet 11  ? atorvastatin (LIPITOR) 10 MG tablet TAKE 1 TABLET BY MOUTH ONCE A DAY FOR CHOLESTEROL 30 tablet 0  ? diltiazem (CARDIZEM CD) 120 MG 24 hr capsule Take 1 capsule (120 mg total) by mouth daily. 30 capsule 3  ? diltiazem (CARDIZEM) 30 MG tablet Take 1 tablet every 4 hours AS NEEDED for heart rate >100 as long as top BP >100. 180 tablet 11  ? fluticasone (FLONASE) 50 MCG/ACT nasal spray Place 2 sprays into both nostrils daily.    ? Multiple Vitamins-Minerals (CENTRUM SILVER 50+MEN) TABS Take by mouth.    ? OLMESARTAN MEDOXOMIL PO     ? tolterodine (DETROL LA) 4 MG 24 hr capsule Take 4 mg by mouth at bedtime.    ? ?No current facility-administered medications for this visit.  ? ? ?Physical Exam: ?Vitals:  ? 11/13/21 0956  ?BP: 124/76  ?Pulse: 95  ?SpO2: 95%  ?Weight: 201 lb (91.2 kg)  ?Height: '5\' 11"'$  (1.803 m)  ? ? ?GEN- The patient is well appearing, alert and oriented x 3 today.   ?Head- normocephalic, atraumatic ?Eyes-  Sclera clear, conjunctiva pink ?Ears- hearing intact ?Oropharynx- clear ?Lungs- normal work of breathing ?Heart- iRRR ?GI- soft,  ?Extremities- no clubbing, cyanosis, or edema ? ?Wt Readings from Last 3 Encounters:  ?11/13/21 201 lb (91.2 kg)  ?10/21/21 200 lb 9.6 oz (91 kg)  ?09/23/21 200 lb (90.7 kg)  ? ?Echo 11/04/21- EF 60%, mild RV dysfunction, mild LA enlargement, mild MR ? ?EKG tracing ordered today is personally reviewed and shows afib ? ?Assessment and Plan: ? ?Persistent atrial fibrillation ?The patient has symptomatic, recurrent  atrial fibrillation.  He is s/p prior PVI and CTI ablations by me. ? ?Chads2vasc score is 2.  he is anticoagulated with eliquis . ?Therapeutic strategies for afib including medicine (tikosyn) and  ablation were discussed in detail with the patient today. Risk, benefits, and alternatives to EP study and radiofrequency ablation for afib were also discussed in detail today. These risks include but are not limited to stroke, bleeding, vascular damage, tamponade, perforation, damage to the esophagus, lungs, and other structures, pulmonary vein stenosis, worsening renal function, and death. The patient understands these risk and wishes to proceed.  We will therefore proceed with catheter ablation at the next available time.  Carto, ICE, anesthesia are requested for the procedure.  Will also obtain cardiac CT prior to the procedure to exclude LAA thrombus and further evaluate atrial anatomy. ? ? ?Risks, benefits and potential toxicities for medications prescribed and/or refilled reviewed with patient today.  ? ?2. HTN ?Stable ?No change required today ? ? ?Thompson Grayer MD, [facility] ?11/13/2021 ?10:00 AM ? ? ? ? ?

## 2021-12-09 ENCOUNTER — Other Ambulatory Visit: Payer: Medicare Other | Admitting: *Deleted

## 2021-12-09 DIAGNOSIS — I4891 Unspecified atrial fibrillation: Secondary | ICD-10-CM

## 2021-12-09 DIAGNOSIS — D6869 Other thrombophilia: Secondary | ICD-10-CM

## 2021-12-09 DIAGNOSIS — I480 Paroxysmal atrial fibrillation: Secondary | ICD-10-CM

## 2021-12-09 DIAGNOSIS — I10 Essential (primary) hypertension: Secondary | ICD-10-CM

## 2021-12-09 LAB — CBC WITH DIFFERENTIAL/PLATELET
Basophils Absolute: 0 10*3/uL (ref 0.0–0.2)
Basos: 0 %
EOS (ABSOLUTE): 0.2 10*3/uL (ref 0.0–0.4)
Eos: 3 %
Hematocrit: 50.3 % (ref 37.5–51.0)
Hemoglobin: 17 g/dL (ref 13.0–17.7)
Immature Grans (Abs): 0 10*3/uL (ref 0.0–0.1)
Immature Granulocytes: 0 %
Lymphocytes Absolute: 2.7 10*3/uL (ref 0.7–3.1)
Lymphs: 31 %
MCH: 30.2 pg (ref 26.6–33.0)
MCHC: 33.8 g/dL (ref 31.5–35.7)
MCV: 90 fL (ref 79–97)
Monocytes Absolute: 0.8 10*3/uL (ref 0.1–0.9)
Monocytes: 10 %
Neutrophils Absolute: 4.9 10*3/uL (ref 1.4–7.0)
Neutrophils: 56 %
Platelets: 183 10*3/uL (ref 150–450)
RBC: 5.62 x10E6/uL (ref 4.14–5.80)
RDW: 12.3 % (ref 11.6–15.4)
WBC: 8.7 10*3/uL (ref 3.4–10.8)

## 2021-12-09 LAB — BASIC METABOLIC PANEL
BUN/Creatinine Ratio: 13 (ref 10–24)
BUN: 14 mg/dL (ref 8–27)
CO2: 23 mmol/L (ref 20–29)
Calcium: 9.1 mg/dL (ref 8.6–10.2)
Chloride: 101 mmol/L (ref 96–106)
Creatinine, Ser: 1.1 mg/dL (ref 0.76–1.27)
Glucose: 91 mg/dL (ref 70–99)
Potassium: 4.2 mmol/L (ref 3.5–5.2)
Sodium: 137 mmol/L (ref 134–144)
eGFR: 73 mL/min/{1.73_m2} (ref >59)

## 2021-12-16 ENCOUNTER — Telehealth (HOSPITAL_COMMUNITY): Payer: Self-pay | Admitting: *Deleted

## 2021-12-16 NOTE — Telephone Encounter (Signed)
Reaching out to patient to offer assistance regarding upcoming cardiac imaging study; pt verbalizes understanding of appt date/time, parking situation and where to check in, pre-test NPO status and medications ordered, and verified current allergies; name and call back number provided for further questions should they arise ? ?Gordy Clement RN Navigator Cardiac Imaging ?[facility] ?[phone] office ?[phone] cell ? ?Patient to take '50mg'$  metoprolol tartrate two hours prior to his cardiac CT scan. He is aware to arrive at 1pm. ?

## 2021-12-17 ENCOUNTER — Ambulatory Visit (HOSPITAL_COMMUNITY)
Admission: RE | Admit: 2021-12-17 | Discharge: 2021-12-17 | Disposition: A | Discharge status: Home / Self Care | Payer: Medicare Other | Source: Ambulatory Visit | Attending: Internal Medicine | Admitting: Internal Medicine

## 2021-12-17 DIAGNOSIS — I4891 Unspecified atrial fibrillation: Secondary | ICD-10-CM | POA: Insufficient documentation

## 2021-12-17 MED ORDER — IOHEXOL 350 MG/ML SOLN
100.0000 mL | Freq: Once | INTRAVENOUS | Status: AC | PRN
  Administered 2021-12-17: 100 mL via INTRAVENOUS

## 2021-12-24 NOTE — Pre-Procedure Instructions (Signed)
Instructed patient on the following items: Arrival time 0530 Nothing to eat or drink after midnight No meds AM of procedure Responsible person to drive you home and stay with you for 24 hrs  Have you missed any doses of anti-coagulant Eliquis- hasn't missed any doses    

## 2021-12-25 NOTE — Anesthesia Preprocedure Evaluation (Addendum)
Anesthesia Evaluation  ?Patient identified by MRN, date of birth, ID band ?Patient awake ? ? ? ?Reviewed: ?Allergy & Precautions, NPO status , Patient's Chart, lab work & pertinent test results ? ?History of Anesthesia Complications ?Negative for: history of anesthetic complications ? ?Airway ?Mallampati: III ? ?TM Distance: >3 FB ?Neck ROM: Full ? ? ? Dental ? ?(+) Teeth Intact, Dental Advisory Given ?  ?Pulmonary ?neg pulmonary ROS,  ?  ?Pulmonary exam normal ? ? ? ? ? ? ? Cardiovascular ?hypertension, Pt. on medications and Pt. on home beta blockers ?Normal cardiovascular exam+ dysrhythmias Atrial Fibrillation  ? ? ?Echo 11/04/21: EF 60-65%, no RWMA, mildly reduced RVSF, mild MR, borderline aortic root dilatation (3m), ascending aorta (357m ?  ?Neuro/Psych ?negative neurological ROS ?   ? GI/Hepatic ?negative GI ROS, Neg liver ROS,   ?Endo/Other  ?negative endocrine ROS ? Renal/GU ?negative Renal ROS  ?negative genitourinary ?  ?Musculoskeletal ?negative musculoskeletal ROS ?(+)  ? Abdominal ?  ?Peds ? Hematology ?negative hematology ROS ?(+)   ?Anesthesia Other Findings ?Eliquis ? Reproductive/Obstetrics ? ?  ? ? ? ? ? ? ? ? ? ? ? ? ? ?  ?  ? ? ? ? ? ? ? ?Anesthesia Physical ?Anesthesia Plan ? ?ASA: 3 ? ?Anesthesia Plan: General  ? ?Post-op Pain Management: Minimal or no pain anticipated  ? ?Induction: Intravenous ? ?PONV Risk Score and Plan: 2 and Ondansetron, Dexamethasone, Treatment may vary due to age or medical condition and Midazolam ? ?Airway Management Planned: Oral ETT ? ?Additional Equipment: None ? ?Intra-op Plan:  ? ?Post-operative Plan: Extubation in OR ? ?Informed Consent: I have reviewed the patients History and Physical, chart, labs and discussed the procedure including the risks, benefits and alternatives for the proposed anesthesia with the patient or authorized representative who has indicated his/her understanding and acceptance.  ? ? ? ?Dental advisory  given ? ?Plan Discussed with:  ? ?Anesthesia Plan Comments:   ? ? ? ? ? ?Anesthesia Quick Evaluation ? ?

## 2021-12-26 ENCOUNTER — Encounter (HOSPITAL_COMMUNITY)
Admission: RE | Disposition: A | Discharge status: Home / Self Care | Payer: Self-pay | Source: Ambulatory Visit | Attending: Internal Medicine

## 2021-12-26 ENCOUNTER — Other Ambulatory Visit: Payer: Self-pay

## 2021-12-26 ENCOUNTER — Ambulatory Visit (HOSPITAL_BASED_OUTPATIENT_CLINIC_OR_DEPARTMENT_OTHER): Payer: Medicare Other | Admitting: Anesthesiology

## 2021-12-26 ENCOUNTER — Ambulatory Visit (HOSPITAL_COMMUNITY)
Admission: RE | Admit: 2021-12-26 | Discharge: 2021-12-26 | Disposition: A | Discharge status: Home / Self Care | Payer: Medicare Other | Source: Ambulatory Visit | Attending: Internal Medicine | Admitting: Internal Medicine

## 2021-12-26 ENCOUNTER — Ambulatory Visit (HOSPITAL_COMMUNITY): Payer: Medicare Other | Admitting: Anesthesiology

## 2021-12-26 ENCOUNTER — Encounter (HOSPITAL_COMMUNITY): Payer: Self-pay | Admitting: Internal Medicine

## 2021-12-26 DIAGNOSIS — I340 Nonrheumatic mitral (valve) insufficiency: Secondary | ICD-10-CM

## 2021-12-26 DIAGNOSIS — Z7901 Long term (current) use of anticoagulants: Secondary | ICD-10-CM | POA: Diagnosis not present

## 2021-12-26 DIAGNOSIS — I4819 Other persistent atrial fibrillation: Secondary | ICD-10-CM

## 2021-12-26 DIAGNOSIS — I4891 Unspecified atrial fibrillation: Secondary | ICD-10-CM | POA: Diagnosis not present

## 2021-12-26 DIAGNOSIS — I10 Essential (primary) hypertension: Secondary | ICD-10-CM | POA: Diagnosis not present

## 2021-12-26 HISTORY — PX: ATRIAL FIBRILLATION ABLATION: EP1191

## 2021-12-26 LAB — POCT ACTIVATED CLOTTING TIME
Activated Clotting Time: 335 seconds
Activated Clotting Time: 347 seconds

## 2021-12-26 SURGERY — ATRIAL FIBRILLATION ABLATION
Anesthesia: General

## 2021-12-26 MED ORDER — LIDOCAINE 2% (20 MG/ML) 5 ML SYRINGE
INTRAMUSCULAR | Status: DC | PRN
  Administered 2021-12-26: 80 mg via INTRAVENOUS

## 2021-12-26 MED ORDER — MIDAZOLAM HCL 2 MG/2ML IJ SOLN
INTRAMUSCULAR | Status: AC
  Filled 2021-12-26: qty 2

## 2021-12-26 MED ORDER — ACETAMINOPHEN 325 MG PO TABS: 650.0000 mg | ORAL_TABLET | ORAL | Status: DC | PRN

## 2021-12-26 MED ORDER — ONDANSETRON HCL 4 MG/2ML IJ SOLN: 4.0000 mg | Freq: Four times a day (QID) | INTRAMUSCULAR | Status: DC | PRN

## 2021-12-26 MED ORDER — FENTANYL CITRATE (PF) 250 MCG/5ML IJ SOLN
INTRAMUSCULAR | Status: DC | PRN
  Administered 2021-12-26: 100 ug via INTRAVENOUS

## 2021-12-26 MED ORDER — MIDAZOLAM HCL 2 MG/2ML IJ SOLN
INTRAMUSCULAR | Status: DC | PRN
  Administered 2021-12-26 (×2): 1 mg via INTRAVENOUS

## 2021-12-26 MED ORDER — HEPARIN (PORCINE) IN NACL 1000-0.9 UT/500ML-% IV SOLN
INTRAVENOUS | Status: AC
Start: 2021-12-26 — End: ?
  Filled 2021-12-26: qty 500

## 2021-12-26 MED ORDER — SODIUM CHLORIDE 0.9% FLUSH: 3.0000 mL | INTRAVENOUS | Status: DC | PRN

## 2021-12-26 MED ORDER — DEXAMETHASONE SODIUM PHOSPHATE 10 MG/ML IJ SOLN
INTRAMUSCULAR | Status: DC | PRN
  Administered 2021-12-26: 5 mg via INTRAVENOUS

## 2021-12-26 MED ORDER — SUGAMMADEX SODIUM 200 MG/2ML IV SOLN
INTRAVENOUS | Status: DC | PRN
  Administered 2021-12-26: 200 mg via INTRAVENOUS

## 2021-12-26 MED ORDER — FENTANYL CITRATE (PF) 100 MCG/2ML IJ SOLN
INTRAMUSCULAR | Status: AC
  Filled 2021-12-26: qty 2

## 2021-12-26 MED ORDER — EPHEDRINE SULFATE-NACL 50-0.9 MG/10ML-% IV SOSY
PREFILLED_SYRINGE | INTRAVENOUS | Status: DC | PRN
  Administered 2021-12-26: 5 mg via INTRAVENOUS

## 2021-12-26 MED ORDER — SODIUM CHLORIDE 0.9 % IV SOLN: 250.0000 mL | INTRAVENOUS | Status: DC | PRN

## 2021-12-26 MED ORDER — HEPARIN SODIUM (PORCINE) 1000 UNIT/ML IJ SOLN
INTRAMUSCULAR | Status: AC
  Filled 2021-12-26: qty 20

## 2021-12-26 MED ORDER — PHENYLEPHRINE HCL-NACL 20-0.9 MG/250ML-% IV SOLN
INTRAVENOUS | Status: DC | PRN
  Administered 2021-12-26: 20 ug/min via INTRAVENOUS

## 2021-12-26 MED ORDER — HEPARIN SODIUM (PORCINE) 1000 UNIT/ML IJ SOLN
INTRAMUSCULAR | Status: DC | PRN
Start: 2021-12-26 — End: 2021-12-26
  Administered 2021-12-26: 15000 [IU] via INTRAVENOUS
  Administered 2021-12-26: 1000 [IU] via INTRAVENOUS

## 2021-12-26 MED ORDER — PROPOFOL 10 MG/ML IV BOLUS
INTRAVENOUS | Status: DC | PRN
  Administered 2021-12-26: 160 mg via INTRAVENOUS

## 2021-12-26 MED ORDER — HEPARIN SODIUM (PORCINE) 1000 UNIT/ML IJ SOLN
INTRAMUSCULAR | Status: DC | PRN
Start: 2021-12-26 — End: 2021-12-26
  Administered 2021-12-26 (×2): 1000 [IU] via INTRAVENOUS

## 2021-12-26 MED ORDER — PROTAMINE SULFATE 10 MG/ML IV SOLN
INTRAVENOUS | Status: DC | PRN
  Administered 2021-12-26: 30 mg via INTRAVENOUS

## 2021-12-26 MED ORDER — HEPARIN (PORCINE) IN NACL 1000-0.9 UT/500ML-% IV SOLN
INTRAVENOUS | Status: AC
  Filled 2021-12-26: qty 500

## 2021-12-26 MED ORDER — HEPARIN (PORCINE) IN NACL 1000-0.9 UT/500ML-% IV SOLN
INTRAVENOUS | Status: DC | PRN
  Administered 2021-12-26 (×3): 500 mL

## 2021-12-26 MED ORDER — ONDANSETRON HCL 4 MG/2ML IJ SOLN
INTRAMUSCULAR | Status: DC | PRN
  Administered 2021-12-26: 4 mg via INTRAVENOUS

## 2021-12-26 MED ORDER — HYDROCODONE-ACETAMINOPHEN 5-325 MG PO TABS: 1.0000 | ORAL_TABLET | ORAL | Status: DC | PRN

## 2021-12-26 MED ORDER — APIXABAN 5 MG PO TABS
5.0000 mg | ORAL_TABLET | Freq: Once | ORAL | Status: AC
  Administered 2021-12-26: 5 mg via ORAL
  Filled 2021-12-26: qty 1

## 2021-12-26 MED ORDER — PANTOPRAZOLE SODIUM 40 MG PO TBEC: 40.0000 mg | DELAYED_RELEASE_TABLET | Freq: Every day | ORAL | 0 refills | Status: DC

## 2021-12-26 MED ORDER — ROCURONIUM BROMIDE 10 MG/ML (PF) SYRINGE
PREFILLED_SYRINGE | INTRAVENOUS | Status: DC | PRN
  Administered 2021-12-26: 10 mg via INTRAVENOUS
  Administered 2021-12-26: 70 mg via INTRAVENOUS

## 2021-12-26 MED ORDER — SODIUM CHLORIDE 0.9% FLUSH: 3.0000 mL | Freq: Two times a day (BID) | INTRAVENOUS | Status: DC

## 2021-12-26 MED ORDER — PHENYLEPHRINE 80 MCG/ML (10ML) SYRINGE FOR IV PUSH (FOR BLOOD PRESSURE SUPPORT)
PREFILLED_SYRINGE | INTRAVENOUS | Status: DC | PRN
Start: 2021-12-26 — End: 2021-12-26
  Administered 2021-12-26: 40 ug via INTRAVENOUS

## 2021-12-26 MED ORDER — SODIUM CHLORIDE 0.9 % IV SOLN: INTRAVENOUS | Status: DC

## 2021-12-26 SURGICAL SUPPLY — 18 items
CATH 8FR REPROCESSED SOUNDSTAR (CATHETERS) ×2 IMPLANT
CATH 8FR SOUNDSTAR REPROCESSED (CATHETERS) IMPLANT
CATH OCTARAY 2.0 F 3-3-3-3-3 (CATHETERS) ×1 IMPLANT
CATH SMTCH THERMOCOOL SF DF (CATHETERS) ×1 IMPLANT
CATH WEB BI DIR CSDF CRV REPRO (CATHETERS) ×1 IMPLANT
CLOSURE PERCLOSE PROSTYLE (VASCULAR PRODUCTS) ×3 IMPLANT
COVER DOME SNAP 22 D (MISCELLANEOUS) ×1 IMPLANT
COVER SWIFTLINK CONNECTOR (BAG) ×2 IMPLANT
NDL BAYLIS TRANSSEPTAL 71CM (NEEDLE) IMPLANT
NEEDLE BAYLIS TRANSSEPTAL 71CM (NEEDLE) ×2 IMPLANT
PACK EP LATEX FREE (CUSTOM PROCEDURE TRAY) ×2
PACK EP LF (CUSTOM PROCEDURE TRAY) ×1 IMPLANT
PAD DEFIB RADIO PHYSIO CONN (PAD) ×2 IMPLANT
SHEATH PINNACLE 7F 10CM (SHEATH) ×2 IMPLANT
SHEATH PINNACLE 9F 10CM (SHEATH) ×1 IMPLANT
SHEATH PROBE COVER 6X72 (BAG) ×2 IMPLANT
SHEATH SWARTZ TS SL2 63CM 8.5F (SHEATH) ×1 IMPLANT
TUBING SMART ABLATE COOLFLOW (TUBING) ×1 IMPLANT

## 2021-12-26 NOTE — Addendum Note (Signed)
Addendum  created 12/26/21 1232 by Lidia Collum, MD  ? Clinical Note Signed  ?  ?

## 2021-12-26 NOTE — Anesthesia Postprocedure Evaluation (Addendum)
Anesthesia Post Note ? ?Patient: Douglas Rodriguez ? ?Procedure(s) Performed: ATRIAL FIBRILLATION ABLATION ? ?  ? ?Patient location during evaluation: Cath Lab ?Anesthesia Type: General ?Level of consciousness: awake and alert ?Pain management: pain level controlled ?Vital Signs Assessment: post-procedure vital signs reviewed and stable ?Respiratory status: spontaneous breathing, nonlabored ventilation and respiratory function stable ?Cardiovascular status: blood pressure returned to baseline and stable ?Postop Assessment: no apparent nausea or vomiting ?Anesthetic complications: no ? ? ?No notable events documented. ? ?Last Vitals:  ?Vitals:  ? 12/26/21 1130 12/26/21 1145  ?BP: (!) 152/94 (!) 138/105  ?Pulse: 84 86  ?Resp: 16 14  ?Temp:    ?SpO2: 95% 92%  ?  ?Last Pain:  ?Vitals:  ? 12/26/21 1110  ?TempSrc:   ?PainSc: 0-No pain  ? ? ?  ?  ?  ?  ?  ?  ? ?Lidia Collum ? ? ? ? ?

## 2021-12-26 NOTE — H&P (Signed)
?PCP: [name]., MD ?Primary Cardiologist: Dr Percival Spanish ?Primary EP: Dr [name] ?  ?Douglas Rodriguez is a 71 y.o. male who presents today for afib ablation.  The patient is s/p PVI by me in 2012 and CTI ablation 2014. He did very well thereafter.  Unfortunately, his afib has recently returned over the past year.  + palpitations and fatigue.  AF has been confirmed by [employer] strips. Today, he denies symptoms of palpitations, chest pain, shortness of breath,  lower extremity edema, dizziness, presyncope, or syncope.  The patient is otherwise without complaint today.  ?  ?    ?Past Medical History:  ?Diagnosis Date  ? Atrial fibrillation ([place])    ?  s/p PVI by JA 4/12  ? Atrial flutter [facility])    ?  s/p CTI ablation by Dr [name] 11/14  ? Basal cell carcinoma    ? BRADYCARDIA-TACHYCARDIA SYNDROME 10/23/2010  ?  Qualifier: Diagnosis of  By: [name], MD, [name]    ? Hyperlipidemia    ? Long term (current) use of anticoagulants 06/07/2013  ?  Eliquis   ? MRSA (methicillin resistant staph aureus) culture positive    ?  colonization discovered 4/12  ? Palpitation 09/14/2013  ? PVC's (premature ventricular contractions)    ? Squamous carcinoma    ?  ?     ?Past Surgical History:  ?Procedure Laterality Date  ? ATRIAL FIBRILLATION ABLATION   12/11/10  ?  PVI ablation by JA  ? ATRIAL FLUTTER ABLATION   07/20/2013  ?  CTI by Dr [name]  ? ATRIAL FLUTTER ABLATION N/A 07/20/2013  ?  Procedure: ATRIAL FLUTTER ABLATION;  Surgeon: [name], MD;  Location: [facility] CATH LAB;  Service: Cardiovascular;  Laterality: N/A;  ? BASAL CELL CARCINOMA EXCISION      ?  "upper lip & back"  ? INGUINAL HERNIA REPAIR   [phone]  ? KNEE ARTHROSCOPY Left ~ 2009  ? SQUAMOUS CELL CARCINOMA EXCISION      ?  "groin"  ? TONSILECTOMY, ADENOIDECTOMY, BILATERAL MYRINGOTOMY AND TUBES      ? TONSILLECTOMY      ?  ?  ?ROS- all systems are reviewed and negatives except as per HPI above ?  ?      ?Current Outpatient Medications  ?Medication Sig Dispense  Refill  ? acetaminophen (TYLENOL) 500 MG tablet Take 1,000 mg by mouth every 6 (six) hours as needed for pain.      ? apixaban (ELIQUIS) 5 MG TABS tablet Take 1 tablet (5 mg total) by mouth 2 (two) times daily. 60 tablet 11  ? atorvastatin (LIPITOR) 10 MG tablet TAKE 1 TABLET BY MOUTH ONCE A DAY FOR CHOLESTEROL 30 tablet 0  ? diltiazem (CARDIZEM CD) 120 MG 24 hr capsule Take 1 capsule (120 mg total) by mouth daily. 30 capsule 3  ? diltiazem (CARDIZEM) 30 MG tablet Take 1 tablet every 4 hours AS NEEDED for heart rate >100 as long as top BP >100. 180 tablet 11  ? fluticasone (FLONASE) 50 MCG/ACT nasal spray Place 2 sprays into both nostrils daily.      ? Multiple Vitamins-Minerals (CENTRUM SILVER 50+MEN) TABS Take by mouth.      ? OLMESARTAN MEDOXOMIL PO        ? tolterodine (DETROL LA) 4 MG 24 hr capsule Take 4 mg by mouth at bedtime.      ?  ?No current facility-administered medications for this visit.  ?  ?  ?Physical Exam: ?Vitals:  ?  12/26/21 0657 12/26/21 0700 12/26/21 0709  ?BP: (!) 138/114 (!) 152/122 (!) 151/114  ?Pulse: 94    ?Resp: 17    ?Temp: 97.7 ?F (36.5 ?C)    ?TempSrc: Oral    ?SpO2: 95%    ?Weight: 88.5 kg    ?Height: '5\' 11"'$  (1.803 m)    ? ? ?GEN- The patient is well appearing, alert and oriented x 3 today.   ?Head- normocephalic, atraumatic ?Eyes-  Sclera clear, conjunctiva pink ?Ears- hearing intact ?Oropharynx- clear ?Neck- supple, ?Lungs- normal work of breathing ?Heart- IRRR ?GI- soft  ?Extremities- no clubbing, cyanosis, or edema, groin is without hematoma/ bruit ?MS- no significant deformity or atrophy ?Skin- no rash or lesion ?Psych- euthymic mood, full affect ?Neuro- strength and sensation are intact ?  ?Assessment and Plan: ?  ?Persistent atrial fibrillation ?The patient has symptomatic, recurrent  atrial fibrillation.  He is s/p prior PVI and CTI ablations by me. ?  ?Chads2vasc score is 2.  he is anticoagulated with eliquis . ? ? ?Risk, benefits, and alternatives to EP study and  radiofrequency ablation for afib were again discussed in detail today. These risks include but are not limited to stroke, bleeding, vascular damage, tamponade, perforation, damage to the esophagus, lungs, and other structures, pulmonary vein stenosis, worsening renal function, and death. The patient understands these risk and wishes to proceed.  ? ? ?Cardiac CT reviewed at length with the patient today. ? ?he reports compliance with [place] without interruption. ? ?Thompson Grayer MD, [facility] ?12/26/2021 ?7:26 AM ? ? ?

## 2021-12-26 NOTE — Discharge Instructions (Signed)

## 2021-12-26 NOTE — Progress Notes (Signed)
BP verified x 3. Taken with different cuffs. Douglas Rodriguez Dr [name] and Anesthesia ie Kenney Houseman and Anesthesiologist made aware ?

## 2021-12-26 NOTE — Anesthesia Procedure Notes (Signed)
Procedure Name: Intubation ?Date/Time: 12/26/2021 7:48 AM ?Performed by: Erick Colace, CRNA ?Pre-anesthesia Checklist: Patient identified, Emergency Drugs available, Suction available and Patient being monitored ?Patient Re-evaluated:Patient Re-evaluated prior to induction ?Oxygen Delivery Method: Circle system utilized ?Preoxygenation: Pre-oxygenation with 100% oxygen ?Induction Type: IV induction ?Ventilation: Mask ventilation without difficulty ?Laryngoscope Size: Mac and 4 ?Grade View: Grade I ?Tube type: Oral ?Tube size: 7.5 mm ?Number of attempts: 1 ?Airway Equipment and Method: Stylet and Oral airway ?Placement Confirmation: ETT inserted through vocal cords under direct vision, positive ETCO2 and breath sounds checked- equal and bilateral ?Secured at: 22 cm ?Tube secured with: Tape ?Dental Injury: Teeth and Oropharynx as per pre-operative assessment  ? ? ? ? ?

## 2021-12-26 NOTE — Transfer of Care (Signed)
Immediate Anesthesia Transfer of Care Note ? ?Patient: Douglas Rodriguez ? ?Procedure(s) Performed: ATRIAL FIBRILLATION ABLATION ? ?Patient Location: Cath Lab ? ?Anesthesia Type:General ? ?Level of Consciousness: drowsy ? ?Airway & Oxygen Therapy: Patient Spontanous Breathing and Patient connected to nasal cannula oxygen ? ?Post-op Assessment: Report given to RN and Post -op Vital signs reviewed and stable ? ?Post vital signs: Reviewed and stable ? ?Last Vitals:  ?Vitals Value Taken Time  ?BP 140/89 12/26/21 0938  ?Temp    ?Pulse 75 12/26/21 0940  ?Resp 11 12/26/21 0940  ?SpO2 97 % 12/26/21 0940  ?Vitals shown include unvalidated device data. ? ?Last Pain:  ?Vitals:  ? 12/26/21 0930  ?TempSrc:   ?PainSc: 0-No pain  ?   ? ?  ? ?Complications: No notable events documented. ?

## 2021-12-29 ENCOUNTER — Encounter (HOSPITAL_COMMUNITY): Payer: Self-pay | Admitting: Internal Medicine

## 2022-01-17 ENCOUNTER — Other Ambulatory Visit (HOSPITAL_COMMUNITY): Payer: Self-pay | Admitting: Physician Assistant

## 2022-01-23 ENCOUNTER — Ambulatory Visit (HOSPITAL_COMMUNITY)
Admission: RE | Admit: 2022-01-23 | Discharge: 2022-01-23 | Disposition: A | Discharge status: Home / Self Care | Payer: Medicare Other | Source: Ambulatory Visit | Attending: Physician Assistant | Admitting: Physician Assistant

## 2022-01-23 ENCOUNTER — Encounter (HOSPITAL_COMMUNITY): Payer: Self-pay | Admitting: Physician Assistant

## 2022-01-23 VITALS — BP 150/90 | HR 60 | Ht 71.0 in | Wt 198.2 lb

## 2022-01-23 DIAGNOSIS — I4892 Unspecified atrial flutter: Secondary | ICD-10-CM | POA: Insufficient documentation

## 2022-01-23 DIAGNOSIS — I480 Paroxysmal atrial fibrillation: Secondary | ICD-10-CM | POA: Diagnosis present

## 2022-01-23 DIAGNOSIS — Z79899 Other long term (current) drug therapy: Secondary | ICD-10-CM | POA: Diagnosis not present

## 2022-01-23 DIAGNOSIS — Z7901 Long term (current) use of anticoagulants: Secondary | ICD-10-CM | POA: Diagnosis not present

## 2022-01-23 DIAGNOSIS — I10 Essential (primary) hypertension: Secondary | ICD-10-CM | POA: Diagnosis not present

## 2022-01-23 DIAGNOSIS — E785 Hyperlipidemia, unspecified: Secondary | ICD-10-CM | POA: Insufficient documentation

## 2022-01-23 DIAGNOSIS — D6869 Other thrombophilia: Secondary | ICD-10-CM | POA: Diagnosis not present

## 2022-01-23 DIAGNOSIS — I483 Typical atrial flutter: Secondary | ICD-10-CM | POA: Diagnosis not present

## 2022-01-23 MED ORDER — OLMESARTAN MEDOXOMIL 20 MG PO TABS: 20.0000 mg | ORAL_TABLET | Freq: Every day | ORAL | Status: DC

## 2022-01-23 MED ORDER — OLMESARTAN MEDOXOMIL 20 MG PO TABS: 20.0000 mg | ORAL_TABLET | Freq: Every day | ORAL | 6 refills | Status: DC

## 2022-01-23 NOTE — Patient Instructions (Signed)
Resume olmesartan '20mg'$  once a day

## 2022-01-23 NOTE — Progress Notes (Signed)
Primary Care Physician: [name]., MD Primary Cardiologist: Dr Percival Spanish  Primary Electrophysiologist: Dr [name]  Referring Physician: Dr Hortense Ramal is a 71 y.o. male with a history of HLD, HTN, atrial flutter, atrial fibrillation who presents for consultation in the [facility].  The patient was diagnosed with atrial fibrillation remotely and had atrial fib ablation in 2012.  He had flutter with ablation in 2014. Patient is on Eliquis for a CHADS2VASC score of 2. He was seen by Dr Percival Spanish on 09/09/21 for increased tachypalpitations. He feels weak and lightheaded while in afib. His diltiazem was increased. He brought in his [name] which shows some true episodes of afib and some SR with PACs.   On follow up today, patient is s/p repeat ablation 12/26/21 with Dr [name]. Patient reports he had one episode of tachypalpitations right after ablation but this resolved within a few hours, no episodes since. He denies CP, swallowing pain, or groin issues. He has noted a steady trend upwards with his BP.    Today, he denies symptoms of palpitations, chest pain, shortness of breath, orthopnea, PND, lower extremity edema, dizziness, presyncope, syncope, snoring, daytime somnolence, bleeding, or neurologic sequela. The patient is tolerating medications without difficulties and is otherwise without complaint today.    Atrial Fibrillation Risk Factors:  he does not have symptoms or diagnosis of sleep apnea. he does not have a history of rheumatic fever.   he has a BMI of Body mass index is 27.64 kg/m.Marland Kitchen Filed Weights   01/23/22 1042  Weight: 89.9 kg     Family History  Problem Relation Age of Onset   Hypertension Neg Hx    Hyperlipidemia Neg Hx    Heart failure Neg Hx    Heart disease Neg Hx    Heart attack Neg Hx      Atrial Fibrillation Management history:  Previous antiarrhythmic drugs: flecainide (did not tolerate), Multaq (cost  prohibitive)  Previous cardioversions: none Previous ablations: PVI 2012, CTI 2014, 12/26/21 CHADS2VASC score: 2 Anticoagulation history: Eliquis   Past Medical History:  Diagnosis Date   Atrial fibrillation ([place])    s/p PVI by [facility] 4/12   Atrial flutter ([place])    s/p CTI ablation by Dr [name] 11/14   Basal cell carcinoma    BRADYCARDIA-TACHYCARDIA SYNDROME 10/23/2010   Qualifier: Diagnosis of  By: [name], MD, James     Hyperlipidemia    Long term (current) use of anticoagulants 06/07/2013   Eliquis    MRSA (methicillin resistant staph aureus) culture positive    colonization discovered 4/12   Palpitation 09/14/2013   PVC's (premature ventricular contractions)    Squamous carcinoma    Past Surgical History:  Procedure Laterality Date   ATRIAL FIBRILLATION ABLATION  12/11/10   PVI ablation by [place] N/A 12/26/2021   Procedure: [place];  Surgeon: Thompson Grayer, MD;  Location: [facility];  Service: Cardiovascular;  Laterality: N/A;   ATRIAL FLUTTER ABLATION  07/20/2013   CTI by Dr [name]   ATRIAL FLUTTER ABLATION N/A 07/20/2013   Procedure: ATRIAL FLUTTER ABLATION;  Surgeon: [name], MD;  Location: [facility] C[facility];  Service: Cardiovascular;  Laterality: N/A;   BASAL CELL CARCINOMA EXCISION     "upper lip & back"   INGUINAL HERNIA REPAIR  1957-1965   KNEE ARTHROSCOPY Left ~ 2009   SQUAMOUS CELL CARCINOMA EXCISION     "groin"  TONSILECTOMY, ADENOIDECTOMY, BILATERAL MYRINGOTOMY AND TUBES     TONSILLECTOMY      Current Outpatient Medications  Medication Sig Dispense Refill   acetaminophen (TYLENOL) 500 MG tablet Take 1,000 mg by mouth every 6 (six) hours as needed for pain.     apixaban (ELIQUIS) 5 MG TABS tablet Take 1 tablet (5 mg total) by mouth 2 (two) times daily. 60 tablet 11   atorvastatin (LIPITOR) 10 MG tablet TAKE 1 TABLET BY MOUTH ONCE A DAY FOR CHOLESTEROL 30 tablet 0   diltiazem (CARDIZEM CD) 120 MG 24 hr capsule  TAKE 1 CAPSULE BY MOUTH EVERY DAY 90 capsule 3   diltiazem (CARDIZEM) 30 MG tablet Take 1 tablet every 4 hours AS NEEDED for heart rate >100 as long as top BP >100. 180 tablet 11   fluticasone (FLONASE) 50 MCG/ACT nasal spray Place 2 sprays into both nostrils daily as needed for allergies or rhinitis.     Multiple Vitamins-Minerals (CENTRUM SILVER 50+MEN) TABS Take 1 tablet by mouth daily.     tolterodine (DETROL LA) 4 MG 24 hr capsule Take 4 mg by mouth at bedtime.     No current facility-administered medications for this encounter.    No Known Allergies  Social History   Socioeconomic History   Marital status: Married    Spouse name: susan   Number of children: 2   Years of education: college   Highest education level: Not on file  Occupational History   Occupation: [employer]  Tobacco Use   Smoking status: Never   Smokeless tobacco: Never   Tobacco comments:    Never smoke 01/23/22  Substance and Sexual Activity   Alcohol use: Not Currently    Alcohol/week: 1.0 standard drink    Types: 1 Cans of beer per week    Comment: no alcohol in a month 09/23/21   Drug use: No   Sexual activity: Yes  Other Topics Concern   Not on file  Social History Narrative   He is married and lives in [place].  He has two children, no grandchildren.     He works in Press photographer.   Social Determinants of Health   Financial Resource Strain: Not on file  Food Insecurity: Not on file  Transportation Needs: Not on file  Physical Activity: Not on file  Stress: Not on file  Social Connections: Not on file  Intimate Partner Violence: Not on file     ROS- All systems are reviewed and negative except as per the HPI above.  Physical Exam: Vitals:   01/23/22 1042  BP: (!) 150/90  Pulse: 60  Weight: 89.9 kg  Height: '5\' 11"'$  (1.803 m)     GEN- The patient is a well appearing male, alert and oriented x 3 today.   HEENT-head normocephalic, atraumatic, sclera clear, conjunctiva pink,  hearing intact, trachea midline. Lungs- Clear to ausculation bilaterally, normal work of breathing Heart- Regular rate and rhythm, no murmurs, rubs or gallops  GI- soft, NT, ND, + BS Extremities- no clubbing, cyanosis, or edema MS- no significant deformity or atrophy Skin- no rash or lesion Psych- euthymic mood, full affect Neuro- strength and sensation are intact   Wt Readings from Last 3 Encounters:  01/23/22 89.9 kg  12/26/21 88.5 kg  11/13/21 91.2 kg    EKG today demonstrates  SR Vent. rate 60 BPM PR interval 182 ms QRS duration 88 ms QT/QTcB 416/416 ms  Echo 11/04/21  1. Left ventricular ejection fraction, by estimation, is 60 to  65%. The  left ventricle has normal function. The left ventricle has no regional  wall motion abnormalities. Left ventricular diastolic function could not  be evaluated.   2. Right ventricular systolic function is mildly reduced. The right  ventricular size is mildly enlarged. Tricuspid regurgitation signal is  inadequate for assessing PA pressure.   3. Left atrial size was mildly dilated.   4. The mitral valve is normal in structure. Mild mitral valve  regurgitation.   5. The aortic valve is tricuspid. Aortic valve regurgitation is not  visualized. No aortic stenosis is present.   6. Aortic dilatation noted. There is borderline dilatation of the aortic  root, measuring 40 mm. There is borderline dilatation of the ascending  aorta, measuring 38 mm.   Epic records are reviewed at length today  CHA2DS2-VASc Score = 2  The patient's score is based upon: CHF History: 0 HTN History: 1 Diabetes History: 0 Stroke History: 0 Vascular Disease History: 0 Age Score: 1 Gender Score: 0       ASSESSMENT AND PLAN: 1. Paroxysmal Atrial Fibrillation/atrial flutter The patient's CHA2DS2-VASc score is 2, indicating a 2.2% annual risk of stroke.   S/p afib ablation 2012 and atrial flutter ablation 2014 S/p repeat ablation 12/26/21 Patient appears  to be maintaining SR. Continue diltiazem 120 mg daily with 30 mg PRN q 4 hours for heart racing. Continue Eliquis 5 mg BID Kardia for home monitoring.   2. Secondary Hypercoagulable State (ICD10:  D68.69) The patient is at significant risk for stroke/thromboembolism based upon his CHA2DS2-VASc Score of 2.  Continue Apixaban (Eliquis).   3. HTN Elevated today and patient noted elevated readings on home BP machine.  Will resume olmesartan 20 mg daily. Patient to keep BP log at home.    Follow up with Dr [name] as scheduled.    [facility] [address] [place], [place] [address] [phone] 01/23/2022 10:51 AM

## 2022-02-02 ENCOUNTER — Other Ambulatory Visit (HOSPITAL_BASED_OUTPATIENT_CLINIC_OR_DEPARTMENT_OTHER): Payer: Self-pay | Admitting: Internal Medicine

## 2022-02-19 ENCOUNTER — Telehealth (HOSPITAL_COMMUNITY): Payer: Self-pay | Admitting: *Deleted

## 2022-02-19 MED ORDER — OLMESARTAN MEDOXOMIL 20 MG PO TABS: 10.0000 mg | ORAL_TABLET | Freq: Every day | ORAL | 6 refills | Status: DC

## 2022-02-19 NOTE — Telephone Encounter (Signed)
Since resuming olmesartan pt has been feeling more poorly and noticed his BP has been running lower 103/74 today.  Discussed with [name] PA will decrease olmesartan to '10mg'$  a day - will call with update next week.

## 2022-03-09 ENCOUNTER — Telehealth (HOSPITAL_COMMUNITY): Payer: Self-pay | Admitting: *Deleted

## 2022-03-09 NOTE — Telephone Encounter (Signed)
Patient called in stating he continues to feel fatigued despite reduction in olmesartan a few weeks back. BP 107/84 HR 85. Pt requesting to stop diltazem. Discussed with [name] PA ok to stop diltiazem since no afib post ablation. Pt in agreement.

## 2022-03-26 ENCOUNTER — Encounter (HOSPITAL_BASED_OUTPATIENT_CLINIC_OR_DEPARTMENT_OTHER): Payer: Self-pay | Admitting: Internal Medicine

## 2022-03-26 ENCOUNTER — Ambulatory Visit (INDEPENDENT_AMBULATORY_CARE_PROVIDER_SITE_OTHER): Payer: Medicare Other | Admitting: Internal Medicine

## 2022-03-26 VITALS — BP 130/88 | HR 90 | Ht 71.0 in | Wt 183.0 lb

## 2022-03-26 DIAGNOSIS — I10 Essential (primary) hypertension: Secondary | ICD-10-CM | POA: Diagnosis not present

## 2022-03-26 DIAGNOSIS — I4819 Other persistent atrial fibrillation: Secondary | ICD-10-CM | POA: Diagnosis not present

## 2022-03-26 MED ORDER — OLMESARTAN MEDOXOMIL 20 MG PO TABS
20.0000 mg | ORAL_TABLET | Freq: Every day | ORAL | 5 refills | Status: DC
Start: 2022-03-26 — End: 2022-12-07

## 2022-03-26 NOTE — Patient Instructions (Addendum)
Medication Instructions:  Your physician has recommended you make the following change in your medication:    Medication increase:   Take Benicar 20 Mg one tablet by mouth daily       Lab Work: None ordered. If you have labs (blood work) drawn today and your tests are completely normal, you will receive your results only by: [place] (if you have MyChart) OR A paper copy in the mail If you have any lab test that is abnormal or we need to change your treatment, we will call you to review the results.  Testing/Procedures: None ordered.  Follow-Up: At [facility], you and your health needs are our priority.  As part of our continuing mission to provide you with exceptional heart care, we have created designated Provider Care Teams.  These Care Teams include your primary Cardiologist (physician) and Advanced Practice Providers (APPs -  Physician Assistants and Nurse Practitioners) who all work together to provide you with the care you need, when you need it.  Your next appointment:   Your physician wants you to follow-up in: 6 Month follow up in the [place] will receive a reminder letter in the mail two months in advance. If you don't receive a letter, please call our office to schedule the follow-up appointment.   Important Information About Sugar

## 2022-03-26 NOTE — Progress Notes (Signed)
PCP: [name]., MD Primary Cardiologist: Dr Hortense Ramal is a 71 y.o. male who presents today for routine electrophysiology followup.  Since his recent afib ablation, the patient reports doing very well.  he denies procedure related complications and is pleased with the results of the procedure.  Today, he denies symptoms of palpitations, chest pain, shortness of breath,  lower extremity edema, dizziness, presyncope, or syncope.  The patient is otherwise without complaint today.  His concerns today center around his son in law (age 44) who had a ruptured brain aneurysm several months ago and recovers slowly.  Past Medical History:  Diagnosis Date   Atrial fibrillation [facility])    s/p PVI by [facility] 4/12   Atrial flutter [facility])    s/p CTI ablation by Dr [name] 11/14   Basal cell carcinoma    BRADYCARDIA-TACHYCARDIA SYNDROME 10/23/2010   Qualifier: Diagnosis of  By: [name], MD, Ivee Poellnitz     Hyperlipidemia    Long term (current) use of anticoagulants 06/07/2013   Eliquis    MRSA (methicillin resistant staph aureus) culture positive    colonization discovered 4/12   Palpitation 09/14/2013   PVC's (premature ventricular contractions)    Squamous carcinoma    Past Surgical History:  Procedure Laterality Date   ATRIAL FIBRILLATION ABLATION  12/11/10   PVI ablation by [place] N/A 12/26/2021   Procedure: ATRIAL FIBRILLATION ABLATION;  Surgeon: Thompson Grayer, MD;  Location: [place] [facility];  Service: Cardiovascular;  Laterality: N/A;   ATRIAL FLUTTER ABLATION  07/20/2013   CTI by Dr [name]   ATRIAL FLUTTER ABLATION N/A 07/20/2013   Procedure: ATRIAL FLUTTER ABLATION;  Surgeon: [name], MD;  Location: [place] CATH LAB;  Service: Cardiovascular;  Laterality: N/A;   BASAL CELL CARCINOMA EXCISION     "upper lip & back"   INGUINAL HERNIA REPAIR  [phone]   KNEE ARTHROSCOPY Left ~ 2009   SQUAMOUS CELL CARCINOMA EXCISION     "groin"   TONSILECTOMY,  ADENOIDECTOMY, BILATERAL MYRINGOTOMY AND TUBES     TONSILLECTOMY      ROS- all systems are personally reviewed and negatives except as per HPI above  Current Outpatient Medications  Medication Sig Dispense Refill   acetaminophen (TYLENOL) 500 MG tablet Take 1,000 mg by mouth every 6 (six) hours as needed for pain.     apixaban (ELIQUIS) 5 MG TABS tablet Take 1 tablet (5 mg total) by mouth 2 (two) times daily. 60 tablet 11   atorvastatin (LIPITOR) 10 MG tablet TAKE 1 TABLET BY MOUTH ONCE A DAY FOR CHOLESTEROL 30 tablet 0   fluticasone (FLONASE) 50 MCG/ACT nasal spray Place 2 sprays into both nostrils daily as needed for allergies or rhinitis.     Multiple Vitamins-Minerals (CENTRUM SILVER 50+MEN) TABS Take 1 tablet by mouth daily.     olmesartan (BENICAR) 20 MG tablet Take 0.5 tablets (10 mg total) by mouth daily. 30 tablet 6   tolterodine (DETROL LA) 4 MG 24 hr capsule Take 4 mg by mouth at bedtime.     diltiazem (CARDIZEM) 30 MG tablet Take 1 tablet every 4 hours AS NEEDED for heart rate >100 as long as top BP >100. (Patient not taking: Reported on 03/26/2022) 180 tablet 11   No current facility-administered medications for this visit.    Physical Exam: Vitals:   03/26/22 1147  BP: 130/88  Pulse: 90  SpO2: 98%  Weight: 183 lb (83 kg)  Height: 5'  11" (1.803 m)    GEN- The patient is well appearing, alert and oriented x 3 today.   Head- normocephalic, atraumatic Eyes-  Sclera clear, conjunctiva pink Ears- hearing intact Oropharynx- clear Lungs- Clear to ausculation bilaterally, normal work of breathing Heart- Regular rate and rhythm, no murmurs, rubs or gallops, PMI not laterally displaced GI- soft, NT, ND, + BS Extremities- no clubbing, cyanosis, or edema  EKG tracing ordered today is personally reviewed and shows sinus  Assessment and Plan:  1. Persistent/ atrial fibrillation/ atrial flutter Doing well s/p redo ablation chads2vasc score is 2.  Continue eliquis  2.  HTN Elevated at home Increase benicar to '20mg'$  daily Follow-up with PCP for labs and bp management  Return to AF clinic in 6 months  Thompson Grayer MD, [facility] 03/26/2022 11:53 AM

## 2022-08-27 ENCOUNTER — Other Ambulatory Visit: Payer: Self-pay | Admitting: Cardiology

## 2022-08-27 DIAGNOSIS — I480 Paroxysmal atrial fibrillation: Secondary | ICD-10-CM

## 2022-08-28 NOTE — Telephone Encounter (Signed)
Eliquis '5mg'$  refill request received. Patient is 71 years old, weight-83kg, Crea-1.10 on 12/09/2021, Diagnosis-Afib, and last seen by Dr. [name] on 03/26/2022. Dose is appropriate based on dosing criteria. Will send in refill to requested pharmacy.

## 2022-09-16 ENCOUNTER — Ambulatory Visit (HOSPITAL_COMMUNITY)
Admission: RE | Admit: 2022-09-16 | Discharge: 2022-09-16 | Disposition: A | Discharge status: Home / Self Care | Payer: Medicare Other | Source: Ambulatory Visit | Attending: Nurse Practitioner | Admitting: Nurse Practitioner

## 2022-09-16 ENCOUNTER — Encounter (HOSPITAL_COMMUNITY): Payer: Self-pay | Admitting: Nurse Practitioner

## 2022-09-16 VITALS — BP 102/76 | HR 77 | Ht 71.0 in | Wt 193.6 lb

## 2022-09-16 DIAGNOSIS — Z7901 Long term (current) use of anticoagulants: Secondary | ICD-10-CM | POA: Insufficient documentation

## 2022-09-16 DIAGNOSIS — G20A1 Parkinson's disease without dyskinesia, without mention of fluctuations: Secondary | ICD-10-CM | POA: Diagnosis not present

## 2022-09-16 DIAGNOSIS — D6869 Other thrombophilia: Secondary | ICD-10-CM | POA: Diagnosis not present

## 2022-09-16 DIAGNOSIS — I480 Paroxysmal atrial fibrillation: Secondary | ICD-10-CM | POA: Diagnosis not present

## 2022-09-16 DIAGNOSIS — I10 Essential (primary) hypertension: Secondary | ICD-10-CM | POA: Insufficient documentation

## 2022-09-16 DIAGNOSIS — E785 Hyperlipidemia, unspecified: Secondary | ICD-10-CM | POA: Insufficient documentation

## 2022-09-16 DIAGNOSIS — Z79899 Other long term (current) drug therapy: Secondary | ICD-10-CM | POA: Insufficient documentation

## 2022-09-16 DIAGNOSIS — I4892 Unspecified atrial flutter: Secondary | ICD-10-CM | POA: Diagnosis not present

## 2022-09-16 NOTE — Progress Notes (Signed)
Primary Care Physician: [name]., MD Primary Cardiologist: Dr Percival Spanish  Primary Electrophysiologist: Dr [name]  Referring Physician: Dr Hortense Ramal is a 72 y.o. male with a history of HLD, HTN, atrial flutter, atrial fibrillation who presents for consultation in the [facility].  The patient was diagnosed with atrial fibrillation remotely and had atrial fib ablation in 2012.  He had flutter with ablation in 2014. Patient is on Eliquis for a CHADS2VASC score of 2. He was seen by Dr Percival Spanish on 09/09/21 for increased tachypalpitations. He feels weak and lightheaded while in afib. His diltiazem was increased. He brought in his [name] which shows some true episodes of afib and some SR with PACs.   On follow up today, patient is s/p repeat ablation 12/26/21 with Dr [name]. Patient reports he had one episode of tachypalpitations right after ablation but this resolved within a few hours, no episodes since. He denies CP, swallowing pain, or groin issues. He has noted a steady trend upwards with his BP.    F/u in afib clinic 09/16/21. Pt in afib clinic  for f/u last ablation in July 2023. He has not noted any afib. He has been diagnosed with Parkinson's  in the last 4-5 months. He has noted his BP has been running  a little lower recently and is going to call his PCP to see if his olmesartan can be stopped. No issues with anticoagulation.  Today, he denies symptoms of palpitations, chest pain, shortness of breath, orthopnea, PND, lower extremity edema, dizziness, presyncope, syncope, snoring, daytime somnolence, bleeding, or neurologic sequela. The patient is tolerating medications without difficulties and is otherwise without complaint today.    Atrial Fibrillation Risk Factors:  he does not have symptoms or diagnosis of sleep apnea. he does not have a history of rheumatic fever.   he has a BMI of Body mass index is 27.64 kg/m.Marland Kitchen Filed  Weights   01/23/22 1042  Weight: 89.9 kg     Family History  Problem Relation Age of Onset   Hypertension Neg Hx    Hyperlipidemia Neg Hx    Heart failure Neg Hx    Heart disease Neg Hx    Heart attack Neg Hx      Atrial Fibrillation Management history:  Previous antiarrhythmic drugs: flecainide (did not tolerate), Multaq (cost prohibitive)  Previous cardioversions: none Previous ablations: PVI 2012, CTI 2014, 12/26/21 CHADS2VASC score: 2 Anticoagulation history: Eliquis   Past Medical History:  Diagnosis Date   Atrial fibrillation ([place])    s/p PVI by [facility] 4/12   Atrial flutter ([place])    s/p CTI ablation by Dr [name] 11/14   Basal cell carcinoma    BRADYCARDIA-TACHYCARDIA SYNDROME 10/23/2010   Qualifier: Diagnosis of  By: [name], MD, James     Hyperlipidemia    Long term (current) use of anticoagulants 06/07/2013   Eliquis    MRSA (methicillin resistant staph aureus) culture positive    colonization discovered 4/12   Palpitation 09/14/2013   PVC's (premature ventricular contractions)    Squamous carcinoma    Past Surgical History:  Procedure Laterality Date   ATRIAL FIBRILLATION ABLATION  12/11/10   PVI ablation by [place] N/A 12/26/2021   Procedure: [place];  Surgeon: Thompson Grayer, MD;  Location: [facility] CV LAB;  Service: Cardiovascular;  Laterality: N/A;   ATRIAL FLUTTER ABLATION  07/20/2013   CTI by Dr [name]   ATRIAL  FLUTTER ABLATION N/A 07/20/2013   Procedure: ATRIAL FLUTTER ABLATION;  Surgeon: [name], MD;  Location: [place] CATH LAB;  Service: Cardiovascular;  Laterality: N/A;   BASAL CELL CARCINOMA EXCISION     "upper lip & back"   INGUINAL HERNIA REPAIR  [phone]   KNEE ARTHROSCOPY Left ~ 2009   SQUAMOUS CELL CARCINOMA EXCISION     "groin"   TONSILECTOMY, ADENOIDECTOMY, BILATERAL MYRINGOTOMY AND TUBES     TONSILLECTOMY      Current Outpatient Medications  Medication Sig Dispense Refill    acetaminophen (TYLENOL) 500 MG tablet Take 1,000 mg by mouth every 6 (six) hours as needed for pain.     apixaban (ELIQUIS) 5 MG TABS tablet Take 1 tablet (5 mg total) by mouth 2 (two) times daily. 60 tablet 11   atorvastatin (LIPITOR) 10 MG tablet TAKE 1 TABLET BY MOUTH ONCE A DAY FOR CHOLESTEROL 30 tablet 0   diltiazem (CARDIZEM CD) 120 MG 24 hr capsule TAKE 1 CAPSULE BY MOUTH EVERY DAY 90 capsule 3   diltiazem (CARDIZEM) 30 MG tablet Take 1 tablet every 4 hours AS NEEDED for heart rate >100 as long as top BP >100. 180 tablet 11   fluticasone (FLONASE) 50 MCG/ACT nasal spray Place 2 sprays into both nostrils daily as needed for allergies or rhinitis.     Multiple Vitamins-Minerals (CENTRUM SILVER 50+MEN) TABS Take 1 tablet by mouth daily.     tolterodine (DETROL LA) 4 MG 24 hr capsule Take 4 mg by mouth at bedtime.     No current facility-administered medications for this encounter.    No Known Allergies  Social History   Socioeconomic History   Marital status: Married    Spouse name: susan   Number of children: 2   Years of education: college   Highest education level: Not on file  Occupational History   Occupation: [employer]  Tobacco Use   Smoking status: Never   Smokeless tobacco: Never   Tobacco comments:    Never smoke 01/23/22  Substance and Sexual Activity   Alcohol use: Not Currently    Alcohol/week: 1.0 standard drink    Types: 1 Cans of beer per week    Comment: no alcohol in a month 09/23/21   Drug use: No   Sexual activity: Yes  Other Topics Concern   Not on file  Social History Narrative   He is married and lives in [place].  He has two children, no grandchildren.     He works in Press photographer.   Social Determinants of Health   Financial Resource Strain: Not on file  Food Insecurity: Not on file  Transportation Needs: Not on file  Physical Activity: Not on file  Stress: Not on file  Social Connections: Not on file  Intimate Partner Violence: Not on  file     ROS- All systems are reviewed and negative except as per the HPI above.  Physical Exam: Vitals:   01/23/22 1042  BP: (!) 150/90  Pulse: 60  Weight: 89.9 kg  Height: '5\' 11"'$  (1.803 m)     GEN- The patient is a well appearing male, alert and oriented x 3 today.   HEENT-head normocephalic, atraumatic, sclera clear, conjunctiva pink, hearing intact, trachea midline. Lungs- Clear to ausculation bilaterally, normal work of breathing Heart- Regular rate and rhythm, no murmurs, rubs or gallops  GI- soft, NT, ND, + BS Extremities- no clubbing, cyanosis, or edema MS- no significant deformity or atrophy Skin- no rash or lesion  Psych- euthymic mood, full affect Neuro- strength and sensation are intact   Wt Readings from Last 3 Encounters:  01/23/22 89.9 kg  12/26/21 88.5 kg  11/13/21 91.2 kg    EKG -Vent. rate 77 BPM PR interval 172 ms QRS duration 84 ms QT/QTcB 366/414 ms P-R-T axes 68 55 42 Sinus rhythm with Premature atrial complexes Otherwise normal ECG When compared with ECG of 23-Jan-2022 10:44, PREVIOUS ECG IS PRESENT  Echo 11/04/21  1. Left ventricular ejection fraction, by estimation, is 60 to 65%. The  left ventricle has normal function. The left ventricle has no regional  wall motion abnormalities. Left ventricular diastolic function could not  be evaluated.   2. Right ventricular systolic function is mildly reduced. The right  ventricular size is mildly enlarged. Tricuspid regurgitation signal is  inadequate for assessing PA pressure.   3. Left atrial size was mildly dilated.   4. The mitral valve is normal in structure. Mild mitral valve  regurgitation.   5. The aortic valve is tricuspid. Aortic valve regurgitation is not  visualized. No aortic stenosis is present.   6. Aortic dilatation noted. There is borderline dilatation of the aortic  root, measuring 40 mm. There is borderline dilatation of the ascending  aorta, measuring 38 mm.   Epic records  are reviewed at length today  CHA2DS2-VASc Score = 2  The patient's score is based upon: CHF History: 0 HTN History: 1 Diabetes History: 0 Stroke History: 0 Vascular Disease History: 0 Age Score: 1 Gender Score: 0       ASSESSMENT AND PLAN: 1. Paroxysmal Atrial Fibrillation/atrial flutter The patient's CHA2DS2-VASc score is 2, indicating a 2.2% annual risk of stroke.   S/p afib ablation 2012 and atrial flutter ablation 2014   S/p repeat ablation 12/26/21 and 03/2022 by Dr. [name]  Patient appears to be maintaining SR. Continue diltiazem 30 mg PRN q 4 hours for heart racing. Continue Eliquis 5 mg BID Kardia for home monitoring.   2. Secondary Hypercoagulable State (ICD10:  D68.69) The patient is at significant risk for stroke/thromboembolism based upon his CHA2DS2-VASc Score of 2.  Continue Apixaban (Eliquis).   3. HTN Stable to low readings Pt will contact PCP re if olmesartan  is still needed    Follow up with Dr Percival Spanish in one year, afib clinic as needed   [place]. Eryanna Regal, [facility] [address] [place], [place] [address] [phone]

## 2022-10-15 ENCOUNTER — Encounter (HOSPITAL_COMMUNITY): Payer: Self-pay | Admitting: *Deleted

## 2022-12-06 ENCOUNTER — Other Ambulatory Visit: Payer: Self-pay | Admitting: Cardiology

## 2023-03-29 IMAGING — CR DG CHEST 2V
2 series · 2 of 2 positions shown · non-contrast
Comparison: None.

CLINICAL DATA: Palpitations, lightheadedness

EXAM:
CHEST - 2 VIEW

[w chest pa]
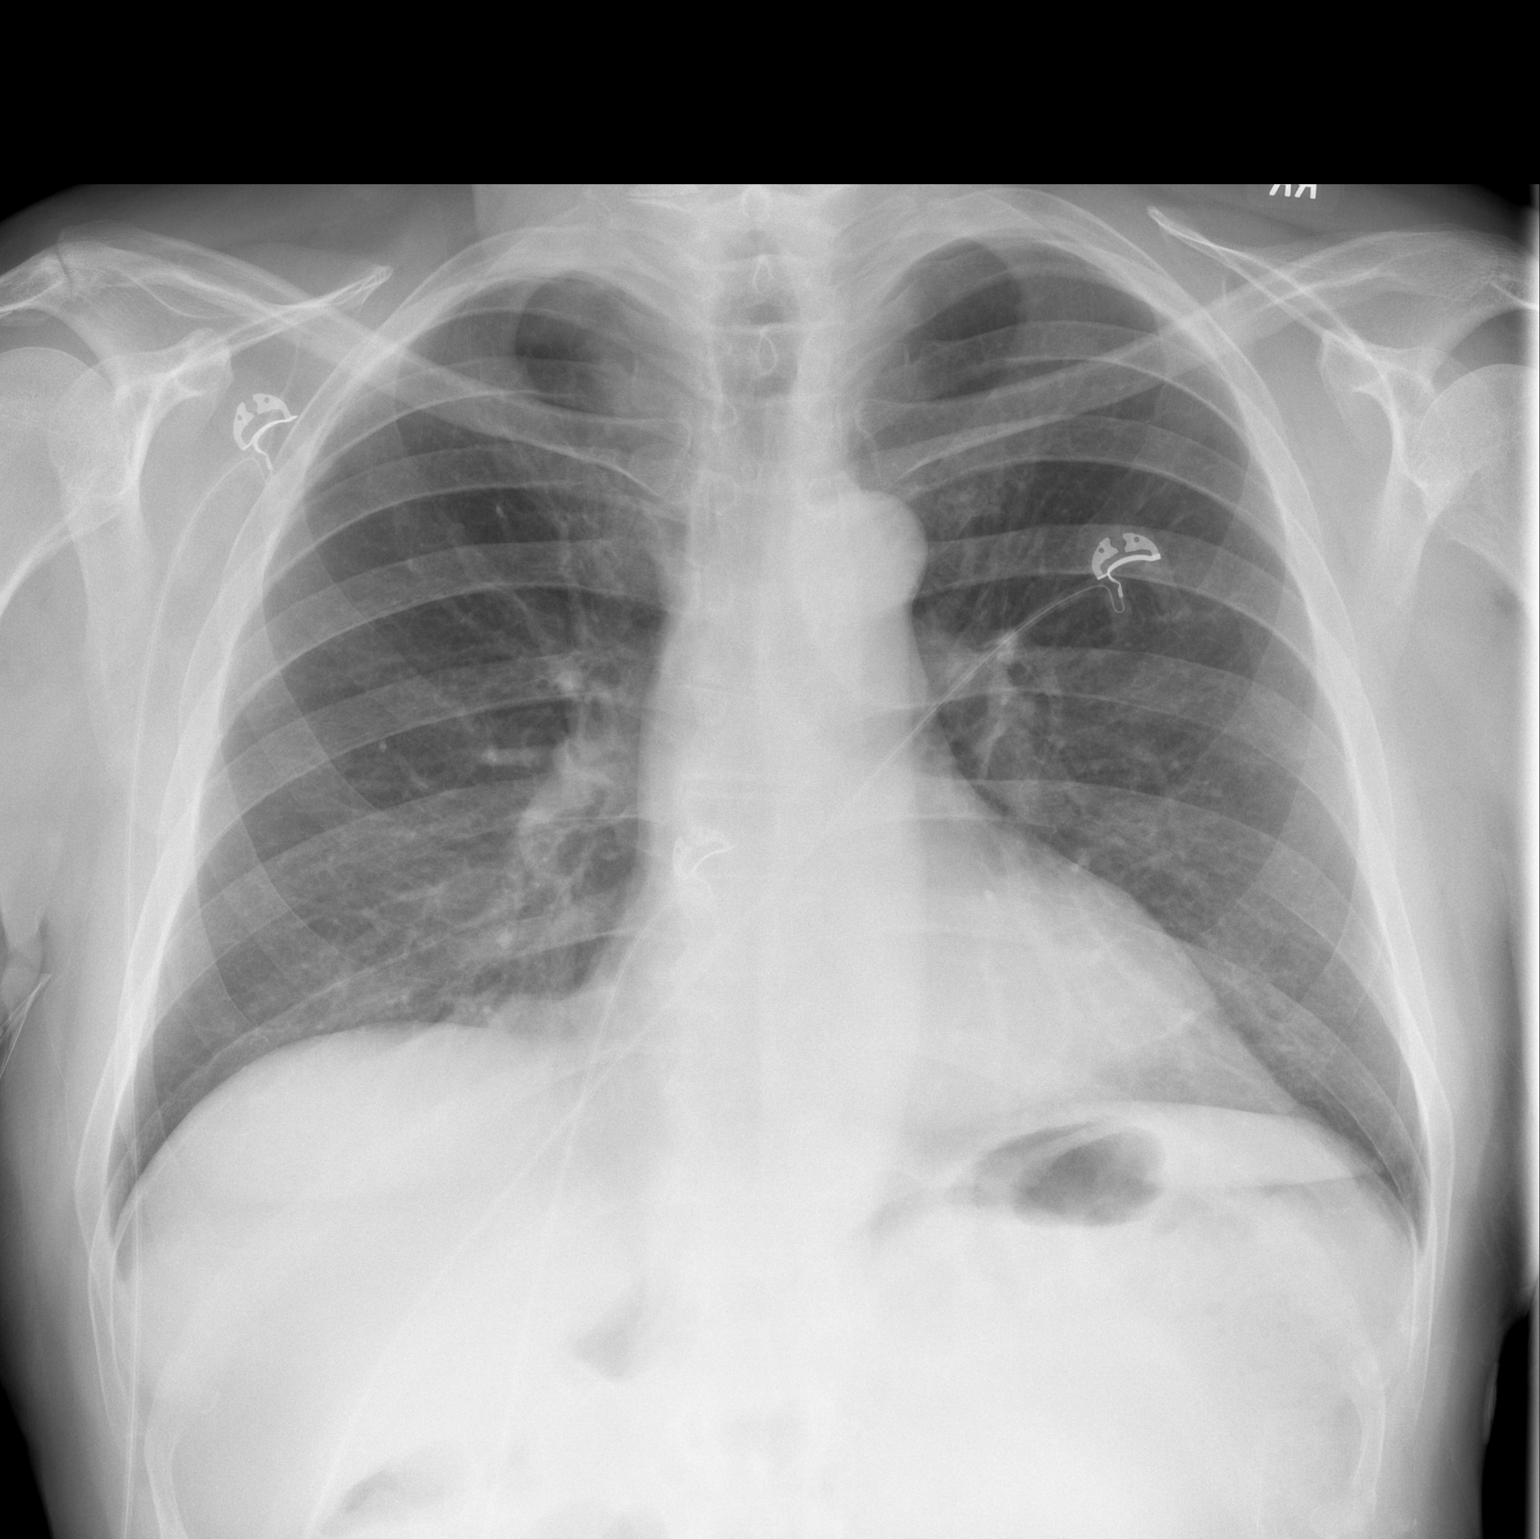

[w chest lat]
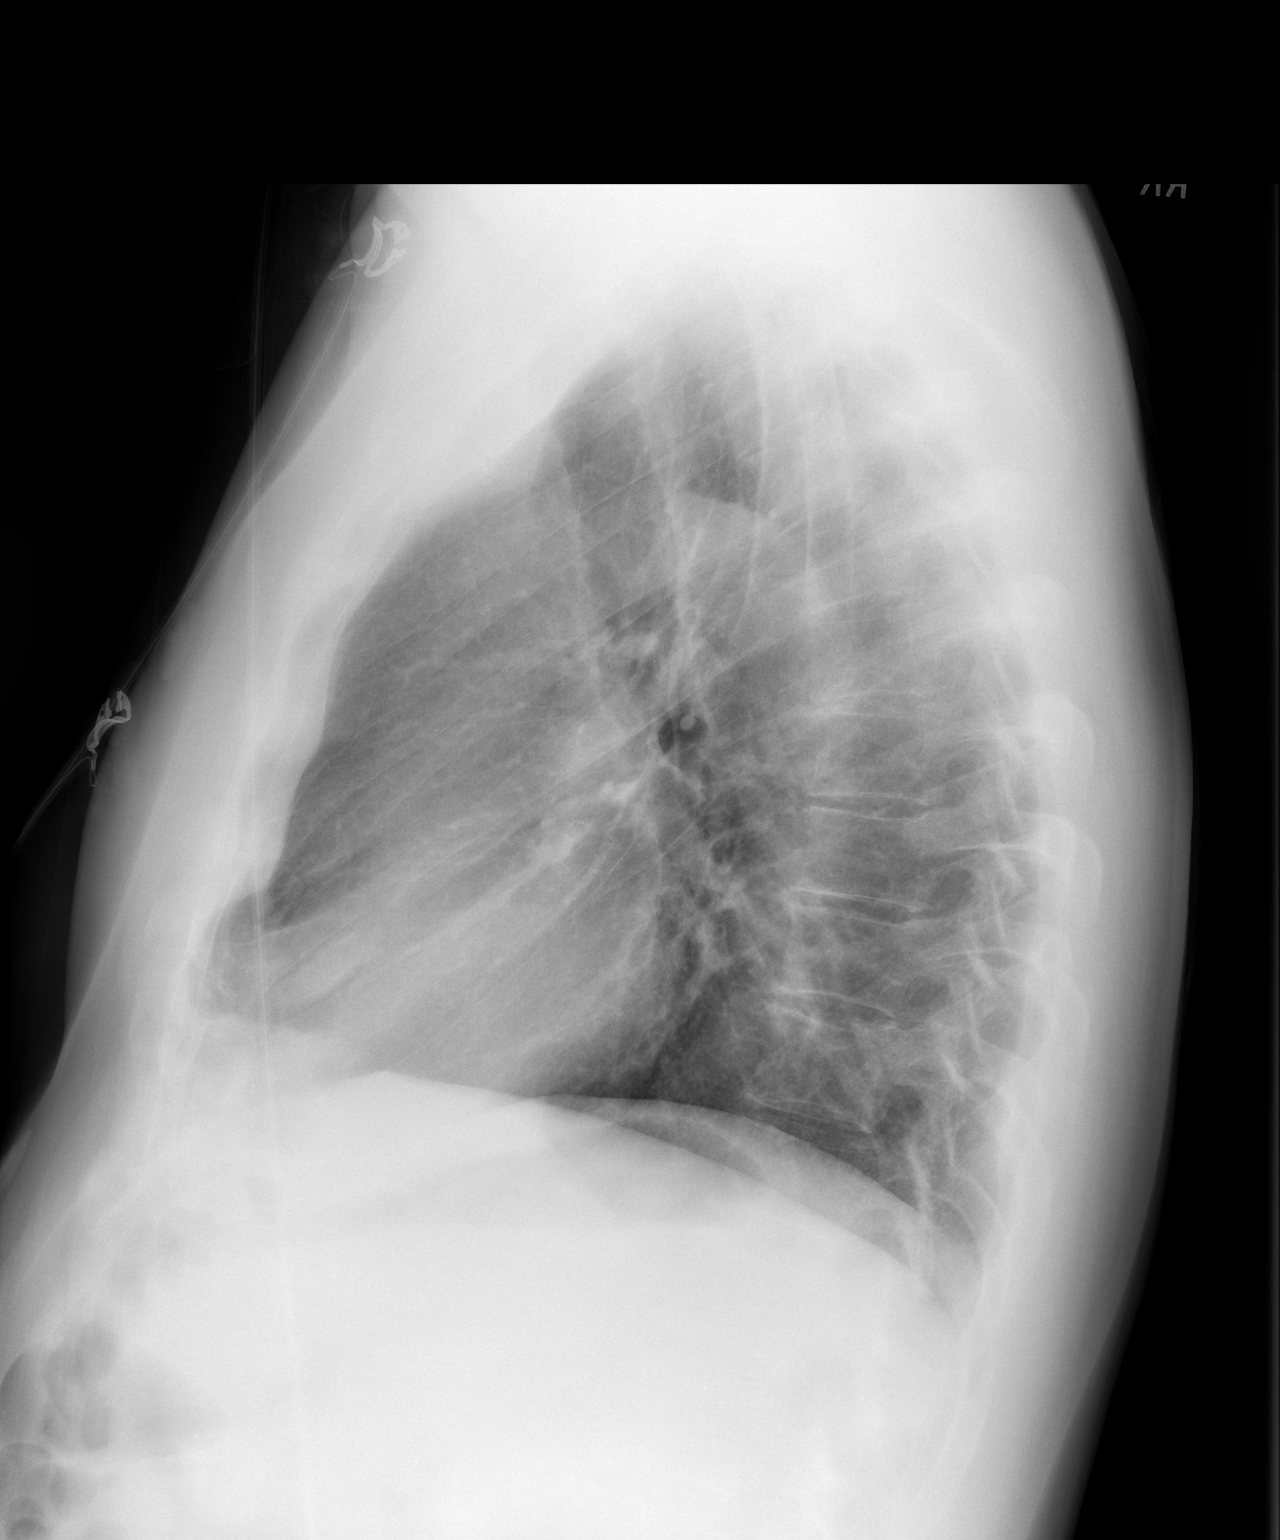

[2 of 2 positions shown; findings below may reference images not displayed]

FINDINGS: The heart size and mediastinal contours are within normal limits.
Both lungs are clear. The visualized skeletal structures are
unremarkable.
IMPRESSION: No acute abnormality of the lungs.

## 2023-05-06 ENCOUNTER — Other Ambulatory Visit: Payer: Self-pay | Admitting: Cardiology

## 2023-05-06 DIAGNOSIS — I480 Paroxysmal atrial fibrillation: Secondary | ICD-10-CM

## 2023-05-06 NOTE — Telephone Encounter (Signed)
Prescription refill request for Eliquis received. Indication: afib  Last office visit: 1/10/2024[name] Scr: 06/11/2022, 1.11 Age: 72 yo  Weight: 87.8 kg   Refill sent.

## 2023-07-30 ENCOUNTER — Other Ambulatory Visit: Payer: Self-pay | Admitting: Cardiology

## 2023-07-30 DIAGNOSIS — I480 Paroxysmal atrial fibrillation: Secondary | ICD-10-CM

## 2023-07-30 NOTE — Telephone Encounter (Signed)
Eliquis 5mg  refill request received. Patient is 72 years old, weight-87.8kg, Crea-1.49 on 06/30/23 via [facility], Diagnosis-Afib, and last seen by Rudi Coco on 09/16/22 and Dr. Antoine Poche on 09/2022. Dose is appropriate based on dosing criteria. Will send in refill to requested pharmacy.

## 2023-09-13 NOTE — Progress Notes (Deleted)
  Cardiology Office Note:   Date:  09/13/2023  ID:  Douglas Rodriguez, DOB [birth year], MRN [id number] PCP: Douglas Rodriguez., MD  [facility] Providers Cardiologist:  Douglas Schilling, MD Electrophysiologist:  OLE ONEIDA HOLTS, MD {  History of Present Illness:   Douglas Rodriguez is a 73 y.o. male who presents today for routine electrophysiology followup.  He has had atrial fib ablation.  ***   ***  Since his recent afib ablation, the patient reports doing very well.  he denies procedure related complications and is pleased with the results of the procedure.  Today, he denies symptoms of palpitations, chest pain, shortness of breath,  lower extremity edema, dizziness, presyncope, or syncope.  The patient is otherwise without complaint today.  His concerns today center around his son in law (age 66) who had a ruptured brain aneurysm several months ago and recovers slowly.   ROS: ***  Studies Reviewed:    EKG:       ***  Risk Assessment/Calculations:   {Does this patient have ATRIAL FIBRILLATION?:[phone]} No BP recorded.  {Refresh Note OR Click here to enter BP  :1}***        Physical Exam:   VS:  There were no vitals taken for this visit.   Wt Readings from Last 3 Encounters:  09/16/22 193 lb 9.6 oz (87.8 kg)  03/26/22 183 lb (83 kg)  01/23/22 198 lb 3.2 oz (89.9 kg)     GEN: Well nourished, well developed in no acute distress NECK: No JVD; No carotid bruits CARDIAC: ***RR, *** murmurs, rubs, gallops RESPIRATORY:  Clear to auscultation without rales, wheezing or rhonchi  ABDOMEN: Soft, non-tender, non-distended EXTREMITIES:  No edema; No deformity   ASSESSMENT AND PLAN:   Persistent atrial fib:  ***  HTN:  ***      Follow up ***  Signed, Douglas Schilling, MD

## 2023-09-15 NOTE — Progress Notes (Signed)
 Cardiology Office Note:   Date:  09/16/2023  ID:  Douglas Rodriguez, DOB [birth year], MRN [id number] PCP: Andrew Truman GRADE., MD  [facility] Providers Cardiologist:  Lynwood Schilling, MD Electrophysiologist:  OLE ONEIDA HOLTS, MD {  History of Present Illness:   Douglas Rodriguez is a 73 y.o. male who presents for follow up of atrial fib.  He had atrial fib ablation in 2012.  He had flutter with ablation in 2014.  He had another ablation by Dr. Kelsie in 2023.  Since that time he might have some occasional palpitations but has had no sustained tacky arrhythmias.  He is Crist does not work anymore so he just takes his pulse.  He is not having any new sustained rhythm issues and has no presyncope or syncope.  He has no new shortness of breath, PND or orthopnea.  He has been diagnosed with Parkinson's and goes to an exercise class for this.  He had a lot of stress in the last couple of years because his son-in-law who was in his mid 30s had a cerebral aneurysm and a very tough hospital course.  This was while he and his wife are expecting their second child.  He is made a good recovery but still has some issues.  The patient was helping and continues to help with that situation.   ROS: As stated in the HPI and negative for all other systems.  Studies Reviewed:    EKG:   EKG Interpretation Date/Time:  Thursday September 16 2023 11:34:16 EST Ventricular Rate:  62 PR Interval:  176 QRS Duration:  84 QT Interval:  398 QTC Calculation: 403 R Axis:   37  Text Interpretation: Normal sinus rhythm Normal ECG When compared with ECG of 16-Sep-2023 11:34, No significant change was found Confirmed by Schilling Lynwood ([id number]) on 09/16/2023 11:41:25 AM    Risk Assessment/Calculations:    CHA2DS2-VASc Score = 2   This indicates a 2.2% annual risk of stroke. The patient's score is based upon: CHF History: 0 HTN History: 1 Diabetes History: 0 Stroke History: 0 Vascular Disease History:  0 Age Score: 1 Gender Score: 0       Physical Exam:   VS:  BP 120/82 (BP Location: Left Arm, Patient Position: Sitting, Cuff Size: Normal)   Pulse 62   Ht 5' 10 (1.778 m)   Wt 199 lb (90.3 kg)   BMI 28.55 kg/m    Wt Readings from Last 3 Encounters:  09/16/23 199 lb (90.3 kg)  09/16/22 193 lb 9.6 oz (87.8 kg)  03/26/22 183 lb (83 kg)     GEN: Well nourished, well developed in no acute distress NECK: No JVD; No carotid bruits CARDIAC: RRR, no murmurs, rubs, gallops RESPIRATORY:  Clear to auscultation without rales, wheezing or rhonchi  ABDOMEN: Soft, non-tender, non-distended EXTREMITIES:  No edema; No deformity   ASSESSMENT AND PLAN:   ATRIAL FIB:  [name] has a CHA2DS2 - VASc score of 2.  We had a significant discussion about risk benefits of continuing Eliquis .  He has had multiple recurrent bouts of fibrillation and may still be having some atrial fibrillation unrecognized.  He has had no problems taking the Eliquis .  He and I both agree that the risk-benefit falls in favor of him continuing.   HTN:   The blood pressure is at target.  No change in therapy.     Follow up with me in about 18 months.  Signed, Lynwood Schilling, MD

## 2023-09-16 ENCOUNTER — Encounter: Payer: Self-pay | Admitting: Cardiology

## 2023-09-16 ENCOUNTER — Ambulatory Visit: Payer: Medicare Other | Admitting: Cardiology

## 2023-09-16 ENCOUNTER — Ambulatory Visit: Payer: Medicare Other | Attending: Cardiology | Admitting: Cardiology

## 2023-09-16 VITALS — BP 120/82 | HR 62 | Ht 70.0 in | Wt 199.0 lb

## 2023-09-16 DIAGNOSIS — I10 Essential (primary) hypertension: Secondary | ICD-10-CM

## 2023-09-16 DIAGNOSIS — I480 Paroxysmal atrial fibrillation: Secondary | ICD-10-CM | POA: Diagnosis not present

## 2023-09-16 DIAGNOSIS — Z8679 Personal history of other diseases of the circulatory system: Secondary | ICD-10-CM

## 2023-09-16 MED ORDER — OLMESARTAN MEDOXOMIL 5 MG PO TABS: 5.0000 mg | ORAL_TABLET | Freq: Every day | ORAL | 3 refills | Status: AC

## 2023-09-16 MED ORDER — APIXABAN 5 MG PO TABS: 5.0000 mg | ORAL_TABLET | Freq: Two times a day (BID) | ORAL | 3 refills | Status: AC

## 2023-09-16 MED ORDER — ATORVASTATIN CALCIUM 10 MG PO TABS: 10.0000 mg | ORAL_TABLET | Freq: Every day | ORAL | 0 refills | Status: AC

## 2023-09-16 NOTE — Patient Instructions (Signed)
 Medication Instructions:  Refills sent to [facility] as requested.  *If you need a refill on your cardiac medications before your next appointment, please call your pharmacy*    Follow-Up: At [facility], you and your health needs are our priority.  As part of our continuing mission to provide you with exceptional heart care, we have created designated Provider Care Teams.  These Care Teams include your primary Cardiologist (physician) and Advanced Practice Providers (APPs -  Physician Assistants and Nurse Practitioners) who all work together to provide you with the care you need, when you need it.  We recommend signing up for the patient portal called MyChart.  Sign up information is provided on this After Visit Summary.  MyChart is used to connect with patients for Virtual Visits (Telemedicine).  Patients are able to view lab/test results, encounter notes, upcoming appointments, etc.  Non-urgent messages can be sent to your provider as well.   To learn more about what you can do with MyChart, go to [email or web address].    Your next appointment:   18 month(s)  Provider:   Lynwood Schilling, MD

## 2024-02-20 IMAGING — CT CT HEART MORPH/PULM VEIN W/ CM & W/O CA SCORE
2 of 5 series · 9 of 20 positions shown, 11 images · IV contrast (APPLIED)
Comparison: None.

Addendum:
CLINICAL DATA: Atrial fibrillation scheduled for ablation.

EXAM:
Cardiac CTA
TECHNIQUE: A non-contrast, gated CT scan was obtained with axial slices of 3 mm
through the heart for calcium scoring. Calcium scoring was performed
using the Agatston method. A 120 kV retrospective, gated, contrast
cardiac scan was obtained. Gantry rotation speed was 250 msecs and
collimation was 0.6 mm. Nitroglycerin was not given. A delayed scan
was obtained to exclude left atrial appendage thrombus. The 3D
dataset was reconstructed in 5% intervals of the 0-95% of the R-R
cycle. Late systolic phases were analyzed on a dedicated workstation
using MPR, MIP, and VRT modes. The patient received 80 cc of
contrast.

[Series 6: best syst · axial · 0.41mm/px · z∈[+1221,+1310]mm · 7 of 296 slices shown, 9 images]
[im 37/296  vessel]
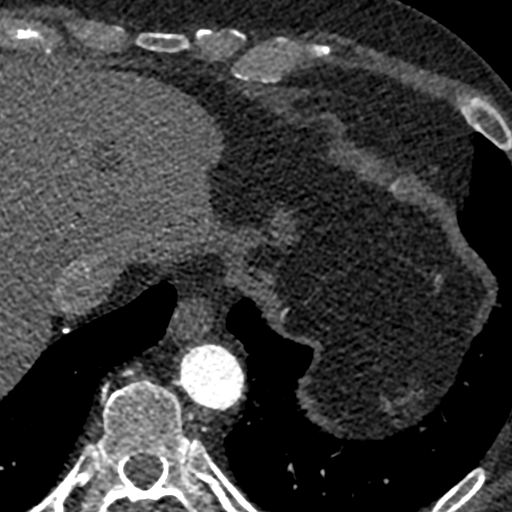
[im 37/296  lung]
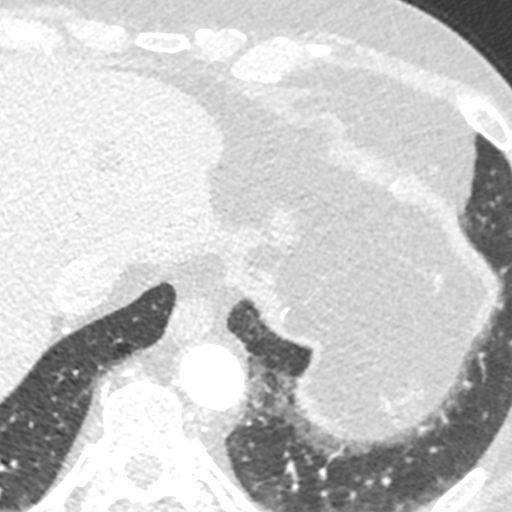
[im 74/296  vessel]
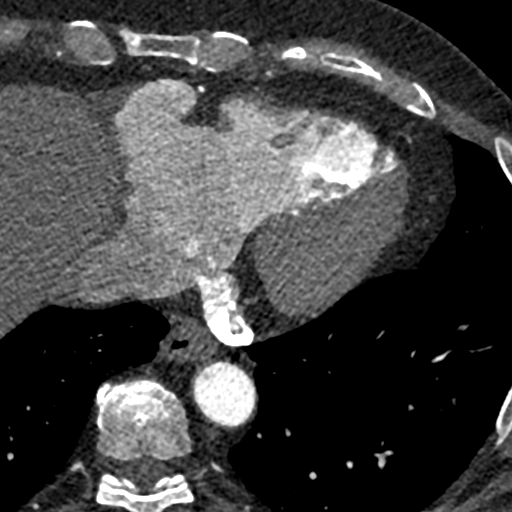
[im 111/296  vessel]
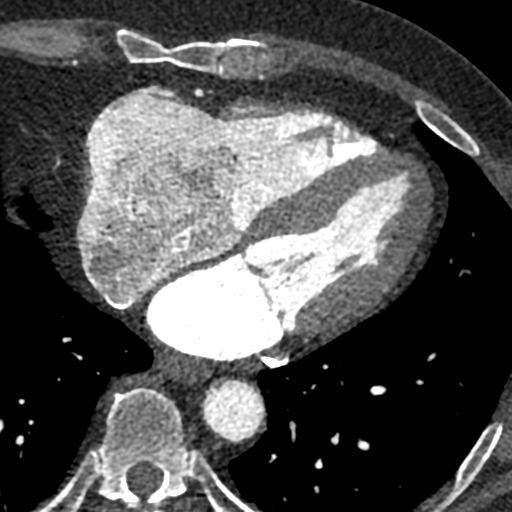
[im 148/296  vessel]
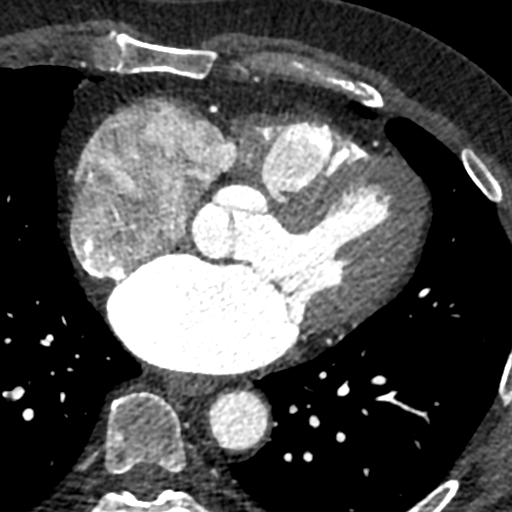
[im 185/296  vessel]
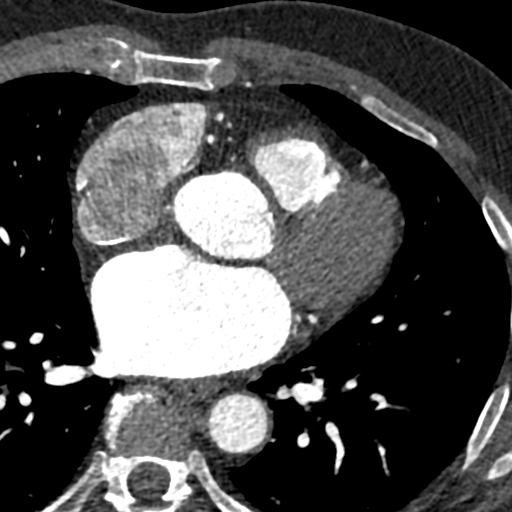
[im 185/296  lung]
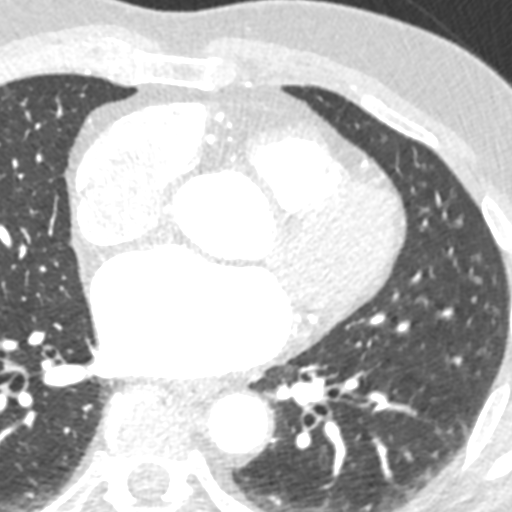
[im 222/296  vessel]
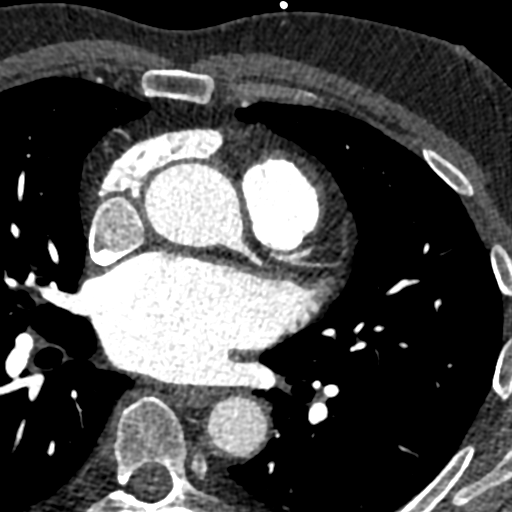
[im 259/296  vessel]
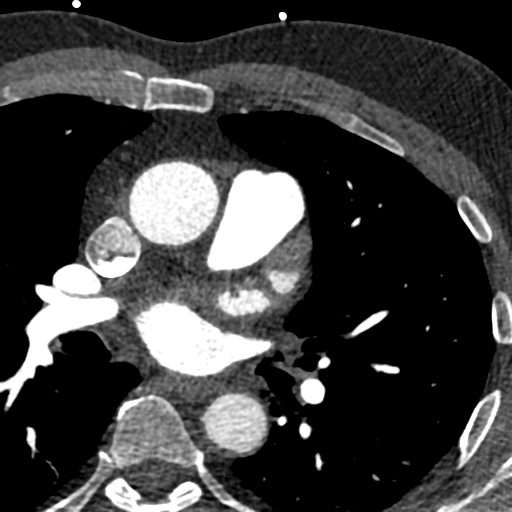

[Series 7: pv delay · axial · portal-venous · 0.41mm/px · z∈[+1278,+1301]mm · 2 of 140 slices shown]
[im 47/140  vessel]
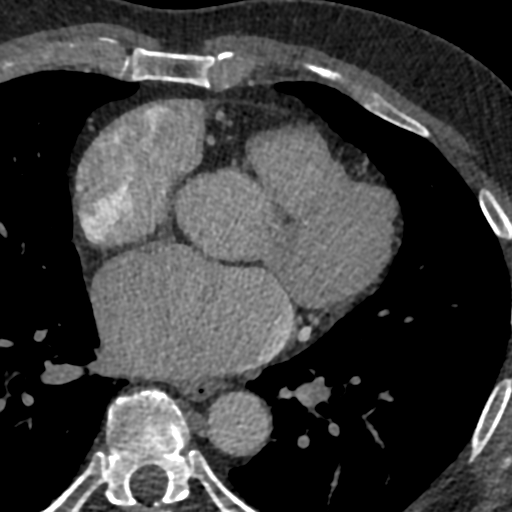
[im 93/140  vessel]
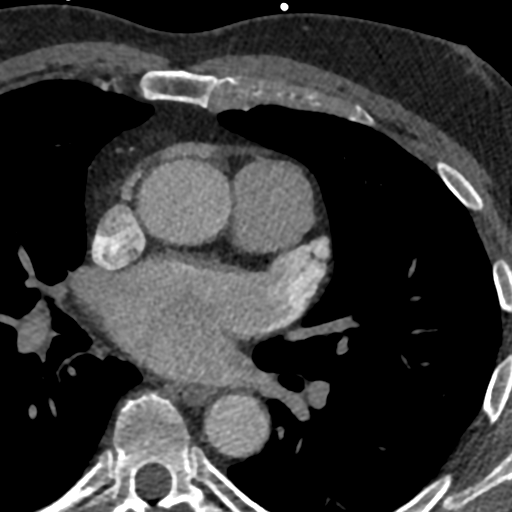

[9 of 20 positions shown; findings below may reference images not displayed]

FINDINGS: Image quality: Excellent.

Noise artifact is: Limited.

Pulmonary Veins: There is normal pulmonary vein drainage into the
left atrium (2 on the right and 2 on the left) with ostial
measurements as follows:

RUPV: Ostium 17.2 mm x 13.1 mm  area 1.55 cm2

RLPV:  Ostium 13.8 mm x 10.5 mm   area 1.10 cm2

LUPV:  Ostium 14.8 mm x 9.99mm area 1.07 cm2

LLPV:  Ostium 11.7 mm x 7.96 mm  area 0.721 cm2

Left Atrium: The left atrial size is normal. There is no PFO/ASD.
The left atrial appendage is large broccoli type. There is no
thrombus in the left atrial appendage on contrast or delayed
imaging. The esophagus runs in the left atrial midline and is not in
proximity to any of the pulmonary vein ostia.

Coronary Arteries: CAC score of 0, which is 0 percentile for age-,
race-, and sex-matched controls. Normal coronary origin. Right
dominance. The study was performed without use of NTG and is
insufficient for plaque evaluation.

Right Atrium: Right atrial size is within normal limits.

Right Ventricle: The right ventricular cavity is within normal
limits.

Left Ventricle: The ventricular cavity size is within normal limits.
There are no stigmata of prior infarction. There is no abnormal
filling defect.

Pericardium: Normal thickness with no significant effusion or
calcium present.

Pulmonary Artery: Normal caliber without proximal filling defect.

Cardiac valves: The aortic valve is trileaflet without significant
calcification. The mitral valve is normal structure without
significant calcification.

Aorta: Normal caliber with no significant disease.

Extra-cardiac findings: See attached radiology report for
non-cardiac structures.
IMPRESSION: 1. There is normal pulmonary vein drainage into the left atrium with
ostial measurements above.

2. There is no thrombus in the left atrial appendage.

3. The esophagus runs in the left atrial midline and is not in
proximity to any of the pulmonary vein ostia.

4. No PFO/ASD.

5. Normal coronary origin. Right dominance.

6. CAC score of 0 which is 0 percentile for age-, race-, and
sex-matched controls.

The noncardiac portion of this study will be interpreted in separate
report by the radiologist.

EXAM:
OVER-READ INTERPRETATION  CT CHEST

The following report is an over-read performed by radiologist Dr.
over-read does not include interpretation of cardiac or coronary
anatomy or pathology. The coronary calcium score and cardiac CTA
interpretation by the cardiologist is attached.
FINDINGS: Within the visualized portions of the thorax there are no suspicious
appearing pulmonary nodules or masses, there is no acute
consolidative airspace disease, no pleural effusions, no
pneumothorax and no lymphadenopathy. Visualized portions of the
upper abdomen are unremarkable. There are no aggressive appearing
lytic or blastic lesions noted in the visualized portions of the
skeleton.
IMPRESSION: 1. No significant incidental noncardiac findings are noted.

*** End of Addendum ***
FINDINGS: Image quality: Excellent.

Noise artifact is: Limited.

Pulmonary Veins: There is normal pulmonary vein drainage into the
left atrium (2 on the right and 2 on the left) with ostial
measurements as follows:

RUPV: Ostium 17.2 mm x 13.1 mm  area 1.55 cm2

RLPV:  Ostium 13.8 mm x 10.5 mm   area 1.10 cm2

LUPV:  Ostium 14.8 mm x 9.99mm area 1.07 cm2

LLPV:  Ostium 11.7 mm x 7.96 mm  area 0.721 cm2

Left Atrium: The left atrial size is normal. There is no PFO/ASD.
The left atrial appendage is large broccoli type. There is no
thrombus in the left atrial appendage on contrast or delayed
imaging. The esophagus runs in the left atrial midline and is not in
proximity to any of the pulmonary vein ostia.

Coronary Arteries: CAC score of 0, which is 0 percentile for age-,
race-, and sex-matched controls. Normal coronary origin. Right
dominance. The study was performed without use of NTG and is
insufficient for plaque evaluation.

Right Atrium: Right atrial size is within normal limits.

Right Ventricle: The right ventricular cavity is within normal
limits.

Left Ventricle: The ventricular cavity size is within normal limits.
There are no stigmata of prior infarction. There is no abnormal
filling defect.

Pericardium: Normal thickness with no significant effusion or
calcium present.

Pulmonary Artery: Normal caliber without proximal filling defect.

Cardiac valves: The aortic valve is trileaflet without significant
calcification. The mitral valve is normal structure without
significant calcification.

Aorta: Normal caliber with no significant disease.

Extra-cardiac findings: See attached radiology report for
non-cardiac structures.
IMPRESSION: 1. There is normal pulmonary vein drainage into the left atrium with
ostial measurements above.

2. There is no thrombus in the left atrial appendage.

3. The esophagus runs in the left atrial midline and is not in
proximity to any of the pulmonary vein ostia.

4. No PFO/ASD.

5. Normal coronary origin. Right dominance.

6. CAC score of 0 which is 0 percentile for age-, race-, and
sex-matched controls.

The noncardiac portion of this study will be interpreted in separate
report by the radiologist.
